# Patient Record
Sex: Male | Born: 1935 | Race: White | Hispanic: No | Marital: Married | Smoking: Never smoker
Health system: Southern US, Community
[De-identification: ages and names within clinical notes are randomized; demographics above are authoritative.]

## PROBLEM LIST (undated history)

## (undated) DIAGNOSIS — E119 Type 2 diabetes mellitus without complications: Secondary | ICD-10-CM

## (undated) DIAGNOSIS — F0390 Unspecified dementia without behavioral disturbance: Secondary | ICD-10-CM

## (undated) DIAGNOSIS — R42 Dizziness and giddiness: Secondary | ICD-10-CM

## (undated) DIAGNOSIS — J189 Pneumonia, unspecified organism: Secondary | ICD-10-CM

## (undated) DIAGNOSIS — K759 Inflammatory liver disease, unspecified: Secondary | ICD-10-CM

## (undated) DIAGNOSIS — C61 Malignant neoplasm of prostate: Secondary | ICD-10-CM

## (undated) DIAGNOSIS — E785 Hyperlipidemia, unspecified: Secondary | ICD-10-CM

## (undated) DIAGNOSIS — N400 Enlarged prostate without lower urinary tract symptoms: Secondary | ICD-10-CM

## (undated) DIAGNOSIS — I639 Cerebral infarction, unspecified: Secondary | ICD-10-CM

## (undated) DIAGNOSIS — F513 Sleepwalking [somnambulism]: Secondary | ICD-10-CM

## (undated) DIAGNOSIS — I10 Essential (primary) hypertension: Secondary | ICD-10-CM

## (undated) HISTORY — PX: PROSTATECTOMY: SHX69

---

## 1975-05-05 DIAGNOSIS — K759 Inflammatory liver disease, unspecified: Secondary | ICD-10-CM

## 1975-05-05 HISTORY — DX: Inflammatory liver disease, unspecified: K75.9

## 2011-10-24 ENCOUNTER — Encounter (HOSPITAL_BASED_OUTPATIENT_CLINIC_OR_DEPARTMENT_OTHER): Payer: Self-pay

## 2011-10-24 ENCOUNTER — Emergency Department (HOSPITAL_BASED_OUTPATIENT_CLINIC_OR_DEPARTMENT_OTHER)
Admission: EM | Admit: 2011-10-24 | Discharge: 2011-10-24 | Disposition: A | Discharge status: Home / Self Care | Payer: Medicare Other | Attending: Emergency Medicine | Admitting: Emergency Medicine

## 2011-10-24 DIAGNOSIS — I10 Essential (primary) hypertension: Secondary | ICD-10-CM

## 2011-10-24 DIAGNOSIS — F0390 Unspecified dementia without behavioral disturbance: Secondary | ICD-10-CM | POA: Insufficient documentation

## 2011-10-24 DIAGNOSIS — Z87891 Personal history of nicotine dependence: Secondary | ICD-10-CM | POA: Insufficient documentation

## 2011-10-24 DIAGNOSIS — E785 Hyperlipidemia, unspecified: Secondary | ICD-10-CM | POA: Insufficient documentation

## 2011-10-24 HISTORY — DX: Benign prostatic hyperplasia without lower urinary tract symptoms: N40.0

## 2011-10-24 HISTORY — DX: Dizziness and giddiness: R42

## 2011-10-24 HISTORY — DX: Hyperlipidemia, unspecified: E78.5

## 2011-10-24 HISTORY — DX: Unspecified dementia, unspecified severity, without behavioral disturbance, psychotic disturbance, mood disturbance, and anxiety: F03.90

## 2011-10-24 MED ORDER — AMLODIPINE BESYLATE 10 MG PO TABS: 5.0000 mg | ORAL_TABLET | Freq: Every day | ORAL | Status: DC

## 2011-10-24 NOTE — Discharge Instructions (Signed)
Arterial Hypertension Arterial hypertension (high blood pressure) is a condition of elevated pressure in your blood vessels. Hypertension over a long period of time is a risk factor for strokes, heart attacks, and heart failure. It is also the leading cause of kidney (renal) failure.  CAUSES   In Adults -- Over 90% of all hypertension has no known cause. This is called essential or primary hypertension. In the other 10% of people with hypertension, the increase in blood pressure is caused by another disorder. This is called secondary hypertension. Important causes of secondary hypertension are:   Heavy alcohol use.   Obstructive sleep apnea.   Hyperaldosterosim (Conn's syndrome).   Steroid use.   Chronic kidney failure.   Hyperparathyroidism.   Medications.   Renal artery stenosis.   Pheochromocytoma.   Cushing's disease.   Coarctation of the aorta.   Scleroderma renal crisis.   Licorice (in excessive amounts).   Drugs (cocaine, methamphetamine).  Your caregiver can explain any items above that apply to you.  In Children -- Secondary hypertension is more common and should always be considered.   Pregnancy -- Few women of childbearing age have high blood pressure. However, up to 10% of them develop hypertension of pregnancy. Generally, this will not harm the woman. It may be a sign of 3 complications of pregnancy: preeclampsia, HELLP syndrome, and eclampsia. Follow up and control with medication is necessary.  SYMPTOMS   This condition normally does not produce any noticeable symptoms. It is usually found during a routine exam.   Malignant hypertension is a late problem of high blood pressure. It may have the following symptoms:   Headaches.   Blurred vision.   End-organ damage (this means your kidneys, heart, lungs, and other organs are being damaged).   Stressful situations can increase the blood pressure. If a person with normal blood pressure has their blood  pressure go up while being seen by their caregiver, this is often termed "white coat hypertension." Its importance is not known. It may be related with eventually developing hypertension or complications of hypertension.   Hypertension is often confused with mental tension, stress, and anxiety.  DIAGNOSIS  The diagnosis is made by 3 separate blood pressure measurements. They are taken at least 1 week apart from each other. If there is organ damage from hypertension, the diagnosis may be made without repeat measurements. Hypertension is usually identified by having blood pressure readings:  Above 140/90 mmHg measured in both arms, at 3 separate times, over a couple weeks.   Over 130/80 mmHg should be considered a risk factor and may require treatment in patients with diabetes.  Blood pressure readings over 120/80 mmHg are called "pre-hypertension" even in non-diabetic patients. To get a true blood pressure measurement, use the following guidelines. Be aware of the factors that can alter blood pressure readings.  Take measurements at least 1 hour after caffeine.   Take measurements 30 minutes after smoking and without any stress. This is another reason to quit smoking - it raises your blood pressure.   Use a proper cuff size. Ask your caregiver if you are not sure about your cuff size.   Most home blood pressure cuffs are automatic. They will measure systolic and diastolic pressures. The systolic pressure is the pressure reading at the start of sounds. Diastolic pressure is the pressure at which the sounds disappear. If you are elderly, measure pressures in multiple postures. Try sitting, lying or standing.   Sit at rest for a minimum of   5 minutes before taking measurements.   You should not be on any medications like decongestants. These are found in many cold medications.   Record your blood pressure readings and review them with your caregiver.  If you have hypertension:  Your caregiver  may do tests to be sure you do not have secondary hypertension (see "causes" above).   Your caregiver may also look for signs of metabolic syndrome. This is also called Syndrome X or Insulin Resistance Syndrome. You may have this syndrome if you have type 2 diabetes, abdominal obesity, and abnormal blood lipids in addition to hypertension.   Your caregiver will take your medical and family history and perform a physical exam.   Diagnostic tests may include blood tests (for glucose, cholesterol, potassium, and kidney function), a urinalysis, or an EKG. Other tests may also be necessary depending on your condition.  PREVENTION  There are important lifestyle issues that you can adopt to reduce your chance of developing hypertension:  Maintain a normal weight.   Limit the amount of salt (sodium) in your diet.   Exercise often.   Limit alcohol intake.   Get enough potassium in your diet. Discuss specific advice with your caregiver.   Follow a DASH diet (dietary approaches to stop hypertension). This diet is rich in fruits, vegetables, and low-fat dairy products, and avoids certain fats.  PROGNOSIS  Essential hypertension cannot be cured. Lifestyle changes and medical treatment can lower blood pressure and reduce complications. The prognosis of secondary hypertension depends on the underlying cause. Many people whose hypertension is controlled with medicine or lifestyle changes can live a normal, healthy life.  RISKS AND COMPLICATIONS  While high blood pressure alone is not an illness, it often requires treatment due to its short- and long-term effects on many organs. Hypertension increases your risk for:  CVAs or strokes (cerebrovascular accident).   Heart failure due to chronically high blood pressure (hypertensive cardiomyopathy).   Heart attack (myocardial infarction).   Damage to the retina (hypertensive retinopathy).   Kidney failure (hypertensive nephropathy).  Your caregiver can  explain list items above that apply to you. Treatment of hypertension can significantly reduce the risk of complications. TREATMENT   For overweight patients, weight loss and regular exercise are recommended. Physical fitness lowers blood pressure.   Mild hypertension is usually treated with diet and exercise. A diet rich in fruits and vegetables, fat-free dairy products, and foods low in fat and salt (sodium) can help lower blood pressure. Decreasing salt intake decreases blood pressure in a 1/3 of people.   Stop smoking if you are a smoker.  The steps above are highly effective in reducing blood pressure. While these actions are easy to suggest, they are difficult to achieve. Most patients with moderate or severe hypertension end up requiring medications to bring their blood pressure down to a normal level. There are several classes of medications for treatment. Blood pressure pills (antihypertensives) will lower blood pressure by their different actions. Lowering the blood pressure by 10 mmHg may decrease the risk of complications by as much as 25%. The goal of treatment is effective blood pressure control. This will reduce your risk for complications. Your caregiver will help you determine the best treatment for you according to your lifestyle. What is excellent treatment for one person, may not be for you. HOME CARE INSTRUCTIONS   Do not smoke.   Follow the lifestyle changes outlined in the "Prevention" section.   If you are on medications, follow the directions   carefully. Blood pressure medications must be taken as prescribed. Skipping doses reduces their benefit. It also puts you at risk for problems.   Follow up with your caregiver, as directed.   If you are asked to monitor your blood pressure at home, follow the guidelines in the "Diagnosis" section above.  SEEK MEDICAL CARE IF:   You think you are having medication side effects.   You have recurrent headaches or lightheadedness.     You have swelling in your ankles.   You have trouble with your vision.  SEEK IMMEDIATE MEDICAL CARE IF:   You have sudden onset of chest pain or pressure, difficulty breathing, or other symptoms of a heart attack.   You have a severe headache.   You have symptoms of a stroke (such as sudden weakness, difficulty speaking, difficulty walking).  MAKE SURE YOU:   Understand these instructions.   Will watch your condition.   Will get help right away if you are not doing well or get worse.  Document Released: 04/20/2005 Document Revised: 04/09/2011 Document Reviewed: 11/18/2006 [employer] Patient Information 2012 [employer]. 

## 2011-10-24 NOTE — ED Provider Notes (Signed)
History     CSN: [id number]  Arrival date & time 10/24/11  1213   First MD Initiated Contact with Patient 10/24/11 1248      Chief Complaint  Patient presents with  . Hypertension    (Consider location/radiation/quality/duration/timing/severity/associated sxs/prior treatment) HPI Pt brought to the ED by his daughter after his wife checked his BP this morning and found it to be high, 195/71. His PCP advised him to come to the ED for evaluation. He has no known history of HTN, but has had several elevated readings on a home BP monitor recently. He has not had any concerning symptoms. No CP, SOB, headache blurry vision, nausea or vomiting.   Past Medical History  Diagnosis Date  . Hyperlipidemia   . Dementia   . Vertigo   . Benign prostatic hypertrophy     Past Surgical History  Procedure Date  . Prostatectomy     No family history on file.  History  Substance Use Topics  . Smoking status: Former Games developer  . Smokeless tobacco: Not on file  . Alcohol Use: No      Review of Systems All other systems reviewed and are negative except as noted in HPI.   Allergies  Review of patient's allergies indicates no known allergies.  Home Medications   Current Outpatient Rx  Name Route Sig Dispense Refill  . ASPIRIN 81 MG PO TABS Oral Take 81 mg by mouth daily.    . DONEPEZIL HCL 10 MG PO TABS Oral Take 10 mg by mouth at bedtime as needed.    Marland Kitchen GLIMEPIRIDE 2 MG PO TABS Oral Take 2 mg by mouth daily before breakfast.    . MECLIZINE HCL 12.5 MG PO TABS Oral Take 12.5 mg by mouth 3 (three) times daily as needed.    Marland Kitchen MEMANTINE HCL 10 MG PO TABS Oral Take 10 mg by mouth 2 (two) times daily.    . OXYBUTYNIN CHLORIDE ER 10 MG PO TB24 Oral Take 10 mg by mouth daily.    Marland Kitchen PRAVASTATIN SODIUM 40 MG PO TABS Oral Take 40 mg by mouth daily.      BP 131/57  Pulse 62  Temp 98.7 F (37.1 C)  Resp 16  Ht 5\' 5"  (1.651 m)  Wt 140 lb (63.504 kg)  BMI 23.30 kg/m2  SpO2 97%  Physical  Exam  Nursing note and vitals reviewed. Constitutional: He is oriented to person, place, and time. He appears well-developed and well-nourished.  HENT:  Head: Normocephalic and atraumatic.  Eyes: EOM are normal. Pupils are equal, round, and reactive to light.  Neck: Normal range of motion. Neck supple.  Cardiovascular: Normal rate, normal heart sounds and intact distal pulses.   Pulmonary/Chest: Effort normal and breath sounds normal.  Abdominal: Bowel sounds are normal. He exhibits no distension. There is no tenderness.  Musculoskeletal: Normal range of motion. He exhibits no edema and no tenderness.  Neurological: He is alert and oriented to person, place, and time. He has normal strength. No cranial nerve deficit or sensory deficit.  Skin: Skin is warm and dry. No rash noted.  Psychiatric: He has a normal mood and affect.    ED Course  Procedures (including critical care time)  Labs Reviewed - No data to display No results found.   No diagnosis found.    MDM  Pt with asymptomatic HTN. BP here is only slightly elevated. Log of BP readings from home ranging from 140-170/70-90s. Advised daughter to ensure home BP cuff is  accurate. Will give Rx for Norvasc 5mg  and advised close PCP followup for recheck this week.         Atley Neubert B. Bernette Mayers, MD 10/24/11 1304

## 2011-10-24 NOTE — ED Notes (Signed)
PT c/o HTN.  Pts daughter states BP was 195/71 at home, called PCP Rosey Bath Stump D.O.) and was advised to bring him to ED for evaluation.

## 2012-07-27 ENCOUNTER — Encounter (HOSPITAL_COMMUNITY): Payer: Self-pay

## 2012-07-27 ENCOUNTER — Observation Stay (HOSPITAL_COMMUNITY)
Admission: EM | Admit: 2012-07-27 | Discharge: 2012-07-29 | Disposition: A | Discharge status: Home / Self Care | Payer: Medicare Other | Attending: Internal Medicine | Admitting: Internal Medicine

## 2012-07-27 ENCOUNTER — Emergency Department (HOSPITAL_COMMUNITY): Payer: Medicare Other

## 2012-07-27 DIAGNOSIS — G459 Transient cerebral ischemic attack, unspecified: Principal | ICD-10-CM | POA: Diagnosis present

## 2012-07-27 DIAGNOSIS — R42 Dizziness and giddiness: Secondary | ICD-10-CM | POA: Diagnosis present

## 2012-07-27 DIAGNOSIS — R4789 Other speech disturbances: Secondary | ICD-10-CM | POA: Insufficient documentation

## 2012-07-27 DIAGNOSIS — R4182 Altered mental status, unspecified: Secondary | ICD-10-CM | POA: Insufficient documentation

## 2012-07-27 DIAGNOSIS — R5381 Other malaise: Secondary | ICD-10-CM | POA: Insufficient documentation

## 2012-07-27 DIAGNOSIS — R29898 Other symptoms and signs involving the musculoskeletal system: Secondary | ICD-10-CM | POA: Insufficient documentation

## 2012-07-27 DIAGNOSIS — E119 Type 2 diabetes mellitus without complications: Secondary | ICD-10-CM | POA: Diagnosis present

## 2012-07-27 DIAGNOSIS — Z66 Do not resuscitate: Secondary | ICD-10-CM | POA: Insufficient documentation

## 2012-07-27 DIAGNOSIS — F0390 Unspecified dementia without behavioral disturbance: Secondary | ICD-10-CM | POA: Insufficient documentation

## 2012-07-27 DIAGNOSIS — I10 Essential (primary) hypertension: Secondary | ICD-10-CM | POA: Diagnosis present

## 2012-07-27 DIAGNOSIS — E860 Dehydration: Secondary | ICD-10-CM | POA: Insufficient documentation

## 2012-07-27 HISTORY — DX: Essential (primary) hypertension: I10

## 2012-07-27 LAB — URINALYSIS, ROUTINE W REFLEX MICROSCOPIC
Ketones, ur: NEGATIVE mg/dL
Leukocytes, UA: NEGATIVE
Nitrite: NEGATIVE
Urobilinogen, UA: 0.2 mg/dL (ref 0.0–1.0)
pH: 5 (ref 5.0–8.0)

## 2012-07-27 LAB — CBC
Platelets: 184 10*3/uL (ref 150–400)
RBC: 4.1 MIL/uL — ABNORMAL LOW (ref 4.22–5.81)
WBC: 11.8 10*3/uL — ABNORMAL HIGH (ref 4.0–10.5)

## 2012-07-27 LAB — DIFFERENTIAL
Lymphocytes Relative: 7 % — ABNORMAL LOW (ref 12–46)
Lymphs Abs: 0.8 10*3/uL (ref 0.7–4.0)
Neutrophils Relative %: 88 % — ABNORMAL HIGH (ref 43–77)

## 2012-07-27 LAB — COMPREHENSIVE METABOLIC PANEL
ALT: 17 U/L (ref 0–53)
Alkaline Phosphatase: 53 U/L (ref 39–117)
CO2: 23 mEq/L (ref 19–32)
GFR calc Af Amer: 46 mL/min — ABNORMAL LOW (ref >90)
GFR calc non Af Amer: 39 mL/min — ABNORMAL LOW (ref >90)
Glucose, Bld: 225 mg/dL — ABNORMAL HIGH (ref 70–99)
Potassium: 4.5 mEq/L (ref 3.5–5.1)
Sodium: 135 mEq/L (ref 135–145)
Total Protein: 6.6 g/dL (ref 6.0–8.3)

## 2012-07-27 LAB — RAPID URINE DRUG SCREEN, HOSP PERFORMED
Barbiturates: NOT DETECTED
Benzodiazepines: NOT DETECTED
Tetrahydrocannabinol: NOT DETECTED

## 2012-07-27 LAB — ETHANOL: Alcohol, Ethyl (B): <11 mg/dL (ref 0–11)

## 2012-07-27 NOTE — ED Notes (Signed)
EMS- pt lives at home with wife. Pt has Hx of dementia. Family states has been more altered today than usual.  Has been not making sense when talking and was weak unable to walk after supper.  Wife also states he has had some frequent urination and c/o burning when he pees.

## 2012-07-27 NOTE — ED Notes (Signed)
Stood pt up to get urine sample.  Pt very unstable on his feet.

## 2012-07-27 NOTE — ED Provider Notes (Signed)
History     CSN: [id number]  Arrival date & time 07/27/12  2050   First MD Initiated Contact with Patient 07/27/12 2106      Chief Complaint  Patient presents with  . Altered Mental Status     Patient is a 79 y.o. male presenting with altered mental status. The history is provided by the patient and a relative.  Altered Mental Status This is a new problem. Episode onset: several hrs ago. The problem occurs constantly. The problem has been gradually improving. Pertinent negatives include no chest pain, no abdominal pain, no headaches and no shortness of breath. Nothing aggravates the symptoms. Nothing relieves the symptoms. He has tried nothing for the symptoms.  family reports that patient had slurred speech earlier tonight (over 3 hrs ago) but no focal weakness.  He did appear more confused but he has h/o dementia and will become confused at times.  He is now improving.    Pt currently denies any complaints He denies HA/CP/SOB.  He denies feeling weak No recent falls in the past 24 hours are reported Past Medical History  Diagnosis Date  . Hyperlipidemia   . Dementia   . Vertigo   . Benign prostatic hypertrophy   . Diabetes mellitus without complication   . Hypertension     Past Surgical History  Procedure Laterality Date  . Prostatectomy      No family history on file.  History  Substance Use Topics  . Smoking status: Never Smoker   . Smokeless tobacco: Not on file  . Alcohol Use: No      Review of Systems  Constitutional: Negative for fever.  Respiratory: Negative for shortness of breath.   Cardiovascular: Negative for chest pain.  Gastrointestinal: Negative for abdominal pain.  Genitourinary: Positive for dysuria and frequency.  Musculoskeletal: Negative for back pain.  Neurological: Negative for headaches.  Psychiatric/Behavioral: Positive for altered mental status. Negative for agitation.  All other systems reviewed and are negative.    Allergies   Review of patient's allergies indicates no known allergies.  Home Medications   Current Outpatient Rx  Name  Route  Sig  Dispense  Refill  . amLODipine (NORVASC) 5 MG tablet   Oral   Take 5 mg by mouth daily.         Marland Kitchen aspirin 81 MG tablet   Oral   Take 81 mg by mouth daily.         . benazepril (LOTENSIN) 20 MG tablet   Oral   Take 20 mg by mouth daily.         Marland Kitchen donepezil (ARICEPT) 10 MG tablet   Oral   Take 10 mg by mouth at bedtime.          . memantine (NAMENDA) 10 MG tablet   Oral   Take 10 mg by mouth 2 (two) times daily.         Marland Kitchen oxybutynin (DITROPAN-XL) 10 MG 24 hr tablet   Oral   Take 10 mg by mouth daily.         . pravastatin (PRAVACHOL) 40 MG tablet   Oral   Take 40 mg by mouth daily.         Marland Kitchen glimepiride (AMARYL) 2 MG tablet   Oral   Take 2 mg by mouth daily before breakfast.           BP 128/63  Pulse 81  Temp(Src) 99.7 F (37.6 C) (Oral)  Resp 30  SpO2 96%  Physical Exam CONSTITUTIONAL: Well developed/well nourished HEAD: Normocephalic/atraumatic EYES: EOMI/PERRL, no nystagmus ENMT: Mucous membranes moist NECK: supple no meningeal signs SPINE:entire spine nontender CV: S1/S2 noted, no murmurs/rubs/gallops noted LUNGS: Lungs are clear to auscultation bilaterally, no apparent distress (resp rate is not 30 on my exam) ABDOMEN: soft, nontender, no rebound or guarding GU:no cva tenderness NEURO: Pt is awake/alert, moves all extremitiesx4 No arm or leg drift.  No facial droop. There is no dysarthria and no aphasia.  He is interactive.   EXTREMITIES: pulses normal, full ROM, no tenderness, no deformity SKIN: warm, color normal PSYCH: no abnormalities of mood noted  ED Course  Procedures (including critical care time)  Labs Reviewed  CBC - Abnormal; Notable for the following:    WBC 11.8 (*)    RBC 4.10 (*)    Hemoglobin 12.6 (*)    HCT 36.8 (*)    All other components within normal limits  DIFFERENTIAL - Abnormal;  Notable for the following:    Neutrophils Relative 88 (*)    Neutro Abs 10.4 (*)    Lymphocytes Relative 7 (*)    All other components within normal limits  ETHANOL  COMPREHENSIVE METABOLIC PANEL  TROPONIN I  URINE RAPID DRUG SCREEN (HOSP PERFORMED)  URINALYSIS, ROUTINE W REFLEX MICROSCOPIC   Dg Chest 2 View  07/27/2012  *RADIOLOGY REPORT*  Clinical Data: Altered mental status.  CHEST - 2 VIEW  Comparison: None.  Findings: The cardiac silhouette, mediastinal and hilar contours are within normal limits for age.  The lungs are clear.  No pleural effusion.  Chronic-appearing bronchitic lung changes.  Colonic interposition noted with bowel on the right hemidiaphragm.  The bony thorax is intact.  IMPRESSION:  1.  Chronic bronchitic type lung changes. 2.  No acute cardiopulmonary findings.   Original Report Authenticated By: Rudie Meyer, M.D.    Pt presents with slurred speech earlier that is improving.    tPA in stroke considered but not given due to:  Symptoms resolved  11:09 PM Pt currently stable.  He is well appearing.  He does not appear to have any residual deficit.  Will admit for possible TIA Patient/family agreeable 11:32 PM D/w dr Adela Glimpse, will admit for TIA observation stay Pt currently in no distress on my exam    MDM  Nursing notes including past medical history and social history reviewed and considered in documentation xrays reviewed and considered Labs/vital reviewed and considered        Date: 07/27/2012  Rate: 80  Rhythm: normal sinus rhythm  QRS Axis: left  Intervals: normal  ST/T Wave abnormalities: nonspecific ST changes  Conduction Disutrbances:none  Narrative Interpretation:   Old EKG Reviewed: none available at time of interpretation    Joya Gaskins, MD 07/27/12 2333

## 2012-07-28 ENCOUNTER — Observation Stay (HOSPITAL_COMMUNITY): Payer: Medicare Other

## 2012-07-28 ENCOUNTER — Encounter (HOSPITAL_COMMUNITY): Payer: Self-pay | Admitting: Internal Medicine

## 2012-07-28 DIAGNOSIS — G459 Transient cerebral ischemic attack, unspecified: Secondary | ICD-10-CM | POA: Diagnosis present

## 2012-07-28 DIAGNOSIS — E119 Type 2 diabetes mellitus without complications: Secondary | ICD-10-CM | POA: Diagnosis present

## 2012-07-28 DIAGNOSIS — R42 Dizziness and giddiness: Secondary | ICD-10-CM | POA: Diagnosis present

## 2012-07-28 DIAGNOSIS — I10 Essential (primary) hypertension: Secondary | ICD-10-CM | POA: Diagnosis present

## 2012-07-28 LAB — BASIC METABOLIC PANEL
BUN: 26 mg/dL — ABNORMAL HIGH (ref 6–23)
Chloride: 100 mEq/L (ref 96–112)
Creatinine, Ser: 1.47 mg/dL — ABNORMAL HIGH (ref 0.50–1.35)
GFR calc Af Amer: 47 mL/min — ABNORMAL LOW (ref >90)
Glucose, Bld: 237 mg/dL — ABNORMAL HIGH (ref 70–99)

## 2012-07-28 LAB — LIPID PANEL
LDL Cholesterol: 58 mg/dL (ref 0–99)
VLDL: 14 mg/dL (ref 0–40)

## 2012-07-28 LAB — GLUCOSE, CAPILLARY: Glucose-Capillary: 202 mg/dL — ABNORMAL HIGH (ref 70–99)

## 2012-07-28 MED ORDER — ACETAMINOPHEN 325 MG PO TABS
650.0000 mg | ORAL_TABLET | ORAL | Status: DC | PRN
  Administered 2012-07-28: 650 mg via ORAL
  Filled 2012-07-28: qty 2

## 2012-07-28 MED ORDER — ASPIRIN 325 MG PO TABS
325.0000 mg | ORAL_TABLET | Freq: Every day | ORAL | Status: DC
  Administered 2012-07-28 – 2012-07-29 (×2): 325 mg via ORAL
  Filled 2012-07-28 (×3): qty 1

## 2012-07-28 MED ORDER — SODIUM CHLORIDE 0.9 % IV SOLN
INTRAVENOUS | Status: AC
  Administered 2012-07-28: 02:00:00 via INTRAVENOUS

## 2012-07-28 MED ORDER — OXYBUTYNIN CHLORIDE ER 10 MG PO TB24
10.0000 mg | ORAL_TABLET | Freq: Every day | ORAL | Status: DC
  Administered 2012-07-28 – 2012-07-29 (×2): 10 mg via ORAL
  Filled 2012-07-28 (×2): qty 1

## 2012-07-28 MED ORDER — DONEPEZIL HCL 10 MG PO TABS
10.0000 mg | ORAL_TABLET | Freq: Every day | ORAL | Status: DC
  Administered 2012-07-28 (×2): 10 mg via ORAL
  Filled 2012-07-28 (×3): qty 1

## 2012-07-28 MED ORDER — INSULIN ASPART 100 UNIT/ML ~~LOC~~ SOLN
0.0000 [IU] | SUBCUTANEOUS | Status: DC
  Administered 2012-07-28: 3 [IU] via SUBCUTANEOUS

## 2012-07-28 MED ORDER — INSULIN ASPART 100 UNIT/ML ~~LOC~~ SOLN
0.0000 [IU] | SUBCUTANEOUS | Status: DC
  Administered 2012-07-28: 3 [IU] via SUBCUTANEOUS
  Administered 2012-07-29 (×2): 2 [IU] via SUBCUTANEOUS

## 2012-07-28 MED ORDER — SIMVASTATIN 20 MG PO TABS
20.0000 mg | ORAL_TABLET | Freq: Every day | ORAL | Status: DC
  Administered 2012-07-28: 20 mg via ORAL
  Filled 2012-07-28 (×2): qty 1

## 2012-07-28 MED ORDER — MEMANTINE HCL 10 MG PO TABS
10.0000 mg | ORAL_TABLET | Freq: Two times a day (BID) | ORAL | Status: DC
  Administered 2012-07-28 – 2012-07-29 (×4): 10 mg via ORAL
  Filled 2012-07-28 (×5): qty 1

## 2012-07-28 MED ORDER — FESOTERODINE FUMARATE ER 4 MG PO TB24
4.0000 mg | ORAL_TABLET | Freq: Every day | ORAL | Status: DC
  Administered 2012-07-28 – 2012-07-29 (×2): 4 mg via ORAL
  Filled 2012-07-28 (×2): qty 1

## 2012-07-28 NOTE — H&P (Signed)
PCP: Marinda Elk   Chief Complaint: Trouble standing up  HPI: Casey Whitney is a 79 y.o. male   has a past medical history of Hyperlipidemia; Dementia; Vertigo; Benign prostatic hypertrophy; Diabetes mellitus without complication; and Hypertension.   Presented with  He developed sudden confusion had slurred speech and started to have bilateral hand shaking. He was generally so weak he could hardly keep his head up but did not lose consciousness. His wife gave him some some bread and butter that he has  vomited shortly thereafter. He could not stand up and EMS was called. Once EMS arrived he rapidly improved. His blood sugar was slightly elevated in 200's. His blood pressure was noted to be 170s over 80s By EMS evaluation he seemed he had generalized weakness and no localized symptoms. Per wife he told her he was a bit weker on the left side but this has not been confirmed by anybody exam. On arrival to ER patient is back to baseline. He did have a low-grade fever up to 99.7 but there was no evidence of infection. Per wife he had not had fever at home. Chest x-ray did not show any evidence of infiltrate or pneumonia. Per his wife he has been coughing but not coughing up any phlegm. Family states he have had some low blood sugars but they're not sure if the blood sugar monitor is accurate. We have held his Amaryl because on occasion his blood sugars drop. [facility] hospitalist called on admission of evaluation of possible TIA versus presyncope  Review of Systems:     Pertinent positives include: localizing neurological complaints,  slurred speech, fatigue,  Constitutional:  No weight loss, night sweats, Fevers, chills,  weight loss  HEENT:  No headaches, Difficulty swallowing,Tooth/dental problems,Sore throat,  No sneezing, itching, ear ache, nasal congestion, post nasal drip,  Cardio-vascular:  No chest pain, Orthopnea, PND, anasarca, dizziness, palpitations.no Bilateral lower extremity  swelling  GI:  No heartburn, indigestion, abdominal pain, nausea, vomiting, diarrhea, change in bowel habits, loss of appetite, melena, blood in stool, hematemesis Resp:  no shortness of breath at rest. No dyspnea on exertion, No excess mucus, no productive cough, No non-productive cough, No coughing up of blood.No change in color of mucus.No wheezing. Skin:  no rash or lesions. No jaundice GU:  no dysuria, change in color of urine, no urgency or frequency. No straining to urinate.  No flank pain.  Musculoskeletal:  No joint pain or no joint swelling. No decreased range of motion. No back pain.  Psych:  No change in mood or affect. No depression or anxiety. No memory loss.  Neuro: nono tingling, no weakness, no double vision, no gait abnormality, nono confusion  Otherwise ROS are negative except for above, 10 systems were reviewed  Past Medical History: Past Medical History  Diagnosis Date  . Hyperlipidemia   . Dementia   . Vertigo   . Benign prostatic hypertrophy   . Diabetes mellitus without complication   . Hypertension    Past Surgical History  Procedure Laterality Date  . Prostatectomy       Medications: Prior to Admission medications   Medication Sig Start Date End Date Taking? Authorizing Provider  amLODipine (NORVASC) 5 MG tablet Take 5 mg by mouth every evening.    Yes Historical Provider, MD  aspirin 81 MG tablet Take 81 mg by mouth daily.   Yes Historical Provider, MD  benazepril (LOTENSIN) 20 MG tablet Take 20 mg by mouth daily.   Yes Historical Provider,  MD  donepezil (ARICEPT) 10 MG tablet Take 10 mg by mouth at bedtime.    Yes Historical Provider, MD  ferrous sulfate 325 (65 FE) MG tablet Take 325 mg by mouth daily with breakfast.   Yes Historical Provider, MD  fish oil-omega-3 fatty acids 1000 MG capsule Take 1 g by mouth 2 (two) times daily.   Yes Historical Provider, MD  glimepiride (AMARYL) 2 MG tablet Take 2 mg by mouth daily before breakfast.   Yes  Historical Provider, MD  memantine (NAMENDA) 10 MG tablet Take 10 mg by mouth 2 (two) times daily.   Yes Historical Provider, MD  menthol-zinc oxide (GOLD BOND) powder Apply 1 application topically every 7 (seven) days. As needed for foot fungus   Yes Historical Provider, MD  oxybutynin (DITROPAN-XL) 10 MG 24 hr tablet Take 10 mg by mouth daily.   Yes Historical Provider, MD  pravastatin (PRAVACHOL) 40 MG tablet Take 40 mg by mouth daily.   Yes Historical Provider, MD  tolterodine (DETROL LA) 2 MG 24 hr capsule Take 2 mg by mouth daily.   Yes Historical Provider, MD    Allergies:  No Known Allergies  Social History:  Ambulatory independently Lives at home with wife   reports that he has never smoked. He does not have any smokeless tobacco history on file. He reports that he does not drink alcohol or use illicit drugs.   Family History: family history includes Dementia in his sister.    Physical Exam: Patient Vitals for the past 24 hrs:  BP Temp Temp src Pulse Resp SpO2  07/28/12 0000 119/50 mmHg - - 81 25 94 %  07/27/12 2345 106/75 mmHg - - 83 27 93 %  07/27/12 2330 119/49 mmHg - - 78 26 89 %  07/27/12 2315 111/55 mmHg - - 79 28 91 %  07/27/12 2300 124/56 mmHg - - 78 25 93 %  07/27/12 2245 118/63 mmHg - - 80 30 93 %  07/27/12 2230 132/54 mmHg - - 76 27 94 %  07/27/12 2215 138/60 mmHg - - 77 30 94 %  07/27/12 2102 128/63 mmHg 99.7 F (37.6 C) Oral 81 30 96 %    1. General:  in No Acute distress 2. Psychological: Alert and  Oriented 3. Head/ENT:   Moist Mucous Membranes                          Head Non traumatic, neck supple                          Poor Dentition 4. SKIN:  decreased Skin turgor,  Skin clean Dry and intact no rash 5. Heart: Regular rate and rhythm no Murmur, Rub or gallop 6. Lungs: Clear to auscultation bilaterally, no wheezes or crackles   7. Abdomen: Soft, non-tender, Non distended 8. Lower extremities: no clubbing, cyanosis, or edema 9. Neurologically  strength 5 out of 5 in no 4 extremities cranial nerves II through XII intact no facial droop noted. No pronator drift noted. Did not ambulate the patient 10. MSK: Normal range of motion  body mass index is unknown because there is no weight on file.   Labs on Admission:   Recent Labs  07/27/12 2125  NA 135  K 4.5  CL 100  CO2 23  GLUCOSE 225*  BUN 29*  CREATININE 1.51*  CALCIUM 9.5    Recent Labs  07/27/12 2125  AST 19  ALT 17  ALKPHOS 53  BILITOT 0.4  PROT 6.6  ALBUMIN 3.6   No results found for this basename: LIPASE, AMYLASE,  in the last 72 hours  Recent Labs  07/27/12 2125  WBC 11.8*  NEUTROABS 10.4*  HGB 12.6*  HCT 36.8*  MCV 89.8  PLT 184    Recent Labs  07/27/12 2124  TROPONINI <0.30   No results found for this basename: TSH, T4TOTAL, FREET3, T3FREE, THYROIDAB,  in the last 72 hours No results found for this basename: VITAMINB12, FOLATE, FERRITIN, TIBC, IRON, RETICCTPCT,  in the last 72 hours No results found for this basename: HGBA1C    The CrCl is unknown because both a height and weight (above a minimum accepted value) are required for this calculation. ABG No results found for this basename: phart, pco2, po2, hco3, tco2, acidbasedef, o2sat     No results found for this basename: DDIMER     Other results:  I have pearsonaly reviewed this: ECG REPORT  Rate:80   Rhythm:SR left anterior block  ST&T Change:no evidence of ischemia  UA  No evidence of UTI   Cultures: No results found for this basename: sdes, specrequest, cult, reptstatus       Radiological Exams on Admission: Dg Chest 2 View  07/27/2012  *RADIOLOGY REPORT*  Clinical Data: Altered mental status.  CHEST - 2 VIEW  Comparison: None.  Findings: The cardiac silhouette, mediastinal and hilar contours are within normal limits for age.  The lungs are clear.  No pleural effusion.  Chronic-appearing bronchitic lung changes.  Colonic interposition noted with bowel on the right  hemidiaphragm.  The bony thorax is intact.  IMPRESSION:  1.  Chronic bronchitic type lung changes. 2.  No acute cardiopulmonary findings.   Original Report Authenticated By: Rudie Meyer, M.D.    Ct Head Wo Contrast  07/27/2012  *RADIOLOGY REPORT*  Clinical Data: Altered mental status.  CT HEAD WITHOUT CONTRAST  Technique:  Contiguous axial images were obtained from the base of the skull through the vertex without contrast.  Comparison: None  Findings: Age related cerebral atrophy, ventriculomegaly and periventricular white matter disease.  No extra-axial fluid collections are identified.  No CT findings for acute hemispheric infarction and/or intracranial hemorrhage.  No mass lesions.  The brainstem and cerebellum grossly normal.  Extensive vascular calcifications are noted.  The bony structures are intact. The right frontal sinus is opacified.  There is scattered ethmoid disease also.  Probable chronic mastoid effusions.  IMPRESSION:  1.  Age related cerebral atrophy, ventriculomegaly and periventricular white matter disease. 2.  No acute intracranial findings or mass lesions. 3.  Scattered sinus disease.   Original Report Authenticated By: Rudie Meyer, M.D.     Chart has been reviewed  Assessment/Plan  79 year old gentleman with history of hypertension here with slurred speech it has rapidly resolved with questionable left-sided subjective weakness. We'll admit for TIA workup but also watch for any signs of emergent infection. Monitor closely blood sugars.  Present on Admission:  . TIA (transient ischemic attack) - given slurred speech and questionable left-sided weakness for admit for today workup. Will order MRI/MRA, and 2-D echo carotid Dopplers. Also given vague symptoms will cycle.. Cardiac markers. It is possible that he had an episode of hypoglycemia which was corrected by eating and normalized by the time EMS arrived. Will hold Amaryl and watch blood sugars every 4 hours.  . DM (diabetes  mellitus) - will hold by mouth medications and sensitive sliding  scale every 4 hours watch blood sugar  . HTN (hypertension) - in emergency department he had few readings of relatively low blood pressures 108/70's, will hold his Norvasc and Lotensin for now and give IV fluids.  . Lightheadedness versus presyncope - etiology unclear will have a TIA workup cycle cardiac markers watch blood sugars. Watch for any evidence of emerging infection. Mild dehydration - slightly elevated creatinine unsure what his baseline is give gentle IV fluids and hold his lisinopril.  Prophylaxis: SCD  Protonix  CODE STATUS: DNR/ DNI per family  Other plan as per orders.  I have spent a total of 55 min on this admission  Cesar Alf 07/28/2012, 12:12 AM

## 2012-07-28 NOTE — Progress Notes (Signed)
PT Cancellation Note  Patient Details Name: Casey Whitney MRN: [id number] DOB: July 26, 1921   Cancelled Treatment:    Reason Eval/Treat Not Completed: Patient not medically ready;Other (comment) (pt. has bedrest orders).  Please indicate when OK to proceed with therapy evals by increasing activity orders.  Thank you!   Ferman Hamming 07/28/2012, 11:50 AM Weldon Picking PT Acute Rehab Services [phone] [place] [phone]

## 2012-07-28 NOTE — ED Notes (Signed)
Report given to floor.  Pt transported to floor via stretcher.

## 2012-07-28 NOTE — Progress Notes (Signed)
[facility] PROGRESS NOTE  Casey Whitney ZOX:[id number] DOB: May 28, 1921 DOA: 07/27/2012 PCP: Berdine Dance, MD  Brief Narrative: He developed sudden confusion had slurred speech and started to have bilateral hand shaking. He was generally so weak he could hardly keep his head up but did not lose consciousness. His wife gave him some some bread and butter that he has vomited shortly thereafter. He could not stand up and EMS was called. Once EMS arrived he rapidly improved. His blood sugar was slightly elevated in 200's. His blood pressure was noted to be 170s over 80s  By EMS evaluation he seemed he had generalized weakness and no localized symptoms. Per wife he told her he was a bit weker on the left side but this has not been confirmed by anybody exam. On arrival to ER patient is back to baseline. He did have a low-grade fever up to 99.7 but there was no evidence of infection. Per wife he had not had fever at home. Chest x-ray did not show any evidence of infiltrate or pneumonia. Per his wife he has been coughing but not coughing up any phlegm.  Family states he have had some low blood sugars but they're not sure if the blood sugar monitor is accurate. We have held his Amaryl because on occasion his blood sugars drop.  [facility] hospitalist called on admission of evaluation of possible TIA versus presyncope  Assessment/Plan: TIA (transient ischemic attack) - given slurred speech and questionable left-sided weakness for admit for today workup. Will order MRI/MRA, and 2-D echo carotid Dopplers. It is possible that he had an episode of hypoglycemia which was corrected by eating and normalized by the time EMS arrived.   DM (diabetes mellitus) - sensitive sliding scale every 4 hours watch blood sugar  - HBA1C pending  HTN (hypertension) - in emergency department he had few readings of relatively low blood pressures 108/70's, will hold his Norvasc and Lotensin for now and give IV fluids. Controlled  BP in the morning.  Code Status: DNR Family Communication: none  Disposition Plan: TBD  Consultants:  none  Procedures:  none  Antibiotics:  none  HPI/Subjective: - no complaints this morning  Objective: Filed Vitals:   07/28/12 0000 07/28/12 0100 07/28/12 0111 07/28/12 0500  BP: 119/50 114/50  115/54  Pulse: 81 77  67  Temp:  98.4 F (36.9 C)  97.9 F (36.6 C)  TempSrc:      Resp: 25 22  18   Height:   5\' 2"  (1.575 m)   Weight:   68.6 kg (151 lb 3.8 oz)   SpO2: 94% 92%  92%   No intake or output data in the 24 hours ending 07/28/12 0807 Filed Weights   07/28/12 0111  Weight: 68.6 kg (151 lb 3.8 oz)    Exam:   General:  NAD  Cardiovascular: regular rate and rhythm, without MRG  Respiratory: good air movement, clear to auscultation throughout, no wheezing, ronchi or rales  Abdomen: soft, not tender to palpation, positive bowel sounds  MSK: no peripheral edema  Neuro: CN 2-12 grossly intact, MS 5/5 in all 4  Data Reviewed: Basic Metabolic Panel:  Recent Labs Lab 07/27/12 2125  NA 135  K 4.5  CL 100  CO2 23  GLUCOSE 225*  BUN 29*  CREATININE 1.51*  CALCIUM 9.5   Liver Function Tests:  Recent Labs Lab 07/27/12 2125  AST 19  ALT 17  ALKPHOS 53  BILITOT 0.4  PROT 6.6  ALBUMIN 3.6   CBC:  Recent Labs Lab 07/27/12 2125  WBC 11.8*  NEUTROABS 10.4*  HGB 12.6*  HCT 36.8*  MCV 89.8  PLT 184   Cardiac Enzymes:  Recent Labs Lab 07/27/12 2124  TROPONINI <0.30   CBG:  Recent Labs Lab 07/27/12 2207 07/28/12 0139 07/28/12 0647  GLUCAP 215* 202* 113*    Studies: Dg Chest 2 View  07/27/2012  *RADIOLOGY REPORT*  Clinical Data: Altered mental status.  CHEST - 2 VIEW  Comparison: None.  Findings: The cardiac silhouette, mediastinal and hilar contours are within normal limits for age.  The lungs are clear.  No pleural effusion.  Chronic-appearing bronchitic lung changes.  Colonic interposition noted with bowel on the right  hemidiaphragm.  The bony thorax is intact.  IMPRESSION:  1.  Chronic bronchitic type lung changes. 2.  No acute cardiopulmonary findings.   Original Report Authenticated By: Rudie Meyer, M.D.    Ct Head Wo Contrast  07/27/2012  *RADIOLOGY REPORT*  Clinical Data: Altered mental status.  CT HEAD WITHOUT CONTRAST  Technique:  Contiguous axial images were obtained from the base of the skull through the vertex without contrast.  Comparison: None  Findings: Age related cerebral atrophy, ventriculomegaly and periventricular white matter disease.  No extra-axial fluid collections are identified.  No CT findings for acute hemispheric infarction and/or intracranial hemorrhage.  No mass lesions.  The brainstem and cerebellum grossly normal.  Extensive vascular calcifications are noted.  The bony structures are intact. The right frontal sinus is opacified.  There is scattered ethmoid disease also.  Probable chronic mastoid effusions.  IMPRESSION:  1.  Age related cerebral atrophy, ventriculomegaly and periventricular white matter disease. 2.  No acute intracranial findings or mass lesions. 3.  Scattered sinus disease.   Original Report Authenticated By: Rudie Meyer, M.D.     Scheduled Meds: . aspirin  325 mg Oral Daily  . donepezil  10 mg Oral QHS  . fesoterodine  4 mg Oral Daily  . insulin aspart  0-9 Units Subcutaneous Q4H  . memantine  10 mg Oral BID  . oxybutynin  10 mg Oral Daily  . simvastatin  20 mg Oral q1800   Continuous Infusions: . sodium chloride 75 mL/hr at 07/28/12 0221    Active Problems:   TIA (transient ischemic attack)   DM (diabetes mellitus)   HTN (hypertension)   Lightheadedness  Pamella Pert, MD [facility] Pager [phone]. If 7 PM - 7 AM, please contact night-coverage at [email or web address], password [facility] 07/28/2012, 8:07 AM  LOS: 1 day

## 2012-07-28 NOTE — Evaluation (Signed)
Occupational Therapy Evaluation Patient Details Name: Casey Whitney MRN: [id number] DOB: [birth year] Today's Date: 07/28/2012 Time: 1410-1450 OT Time Calculation (min): 40 min  OT Assessment / Plan / Recommendation Clinical Impression  Pt demos decline in safety and balance during ADLs and ADL mobility. Pt would benefit from OT services to address these impairments to help restore PLOF to return home safely    OT Assessment  Patient needs continued OT Services    Follow Up Recommendations  Supervision/Assistance - 24 hour;Home health OT    Barriers to Discharge Decreased caregiver support Pt and his wife live with their dtr, another dtr comes over for assist prn. Pt and wife at home alone for approx 6 hrs/day   Equipment Recommendations  None recommended by OT    Recommendations for Other Services    Frequency  Min 2X/week    Precautions / Restrictions Precautions Precautions: Fall Restrictions Weight Bearing Restrictions: No   Pertinent Vitals/Pain     ADL  Eating/Feeding: Performed;Independent Where Assessed - Eating/Feeding: Edge of bed Grooming: Performed;Wash/dry hands;Wash/dry face;Min guard Where Assessed - Grooming: Supported standing Upper Body Bathing: Simulated;Supervision/safety;Set up Where Assessed - Upper Body Bathing: Unsupported sitting Lower Body Bathing: Simulated;Minimal assistance Where Assessed - Lower Body Bathing: Supported sitting Upper Body Dressing: Performed;Supervision/safety;Set up Lower Body Dressing: Minimal assistance Where Assessed - Lower Body Dressing: Supported sitting Toilet Transfer: Performed;Minimal assistance Toilet Transfer Equipment: Regular height toilet;Grab bars Toileting - Clothing Manipulation and Hygiene: Performed;Minimal assistance Where Assessed - Engineer, mining and Hygiene: Standing Equipment Used: Other (comment) (RW) Transfers/Ambulation Related to ADLs: cues for safe hand plaacement.  Pt.  kyphotic/flexed in moving sit<>stand. Cues for body position inside of walker    OT Diagnosis: Generalized weakness  OT Problem List: Decreased strength;Decreased knowledge of use of DME or AE;Decreased knowledge of precautions;Decreased safety awareness;Impaired balance (sitting and/or standing) OT Treatment Interventions: Self-care/ADL training;Balance training;Therapeutic exercise;Neuromuscular education;Therapeutic activities;DME and/or AE instruction;Patient/family education   OT Goals Acute Rehab OT Goals OT Goal Formulation: With patient/family Time For Goal Achievement: 08/04/12 Potential to Achieve Goals: Good ADL Goals Pt Will Perform Grooming: with set-up;with supervision;Standing at sink ADL Goal: Grooming - Progress: Goal set today Pt Will Perform Lower Body Bathing: with set-up;with supervision ADL Goal: Lower Body Bathing - Progress: Goal set today Pt Will Perform Lower Body Dressing: with set-up;with supervision ADL Goal: Lower Body Dressing - Progress: Goal set today Pt Will Transfer to Toilet: with supervision;with DME ADL Goal: Toilet Transfer - Progress: Goal set today Pt Will Perform Toileting - Clothing Manipulation: with supervision ADL Goal: Toileting - Clothing Manipulation - Progress: Goal set today Pt Will Perform Toileting - Hygiene: with supervision ADL Goal: Toileting - Hygiene - Progress: Goal set today  Visit Information  Last OT Received On: 07/28/12 Assistance Needed: +1 PT/OT Co-Evaluation/Treatment: Yes    Subjective Data  Subjective: " I am doing pretty good " Patient Stated Goal: To return home   Prior Functioning     Home Living Lives With: Daughter;Other (Comment) Available Help at Discharge: Family;Available 24 hours/day Type of Home: House Home Access: Stairs to enter [employer] of Steps: 2 Entrance Stairs-Rails: Left Home Layout: One level Bathroom Shower/Tub: Health visitor: Standard Bathroom  Accessibility: Yes How Accessible: Accessible via walker Home Adaptive Equipment: Shower chair with back;Hand-held shower hose;Grab bars in shower;Walker - rolling Prior Function Level of Independence: Independent Able to Take Stairs?: Yes Driving: No Vocation: Retired Musician: No difficulties Dominant Hand: Right  Vision/Perception Vision - History Baseline Vision: Wears glasses all the time Patient Visual Report: No change from baseline Perception Perception: Within Functional Limits   Cognition  Cognition Overall Cognitive Status: Appears within functional limits for tasks assessed/performed Arousal/Alertness: Awake/alert Orientation Level: Appears intact for tasks assessed Behavior During Session: [facility] for tasks performed    Extremity/Trunk Assessment Right Upper Extremity Assessment RUE ROM/Strength/Tone: [facility] for tasks assessed Left Upper Extremity Assessment LUE ROM/Strength/Tone: WFL for tasks assessed Right Lower Extremity Assessment RLE ROM/Strength/Tone: WFL for tasks assessed RLE Sensation: WFL - Light Touch RLE Coordination: WFL - gross motor Left Lower Extremity Assessment LLE ROM/Strength/Tone: WFL for tasks assessed LLE Sensation: WFL - Light Touch LLE Coordination: WFL - gross motor Trunk Assessment Trunk Assessment: Kyphotic     Mobility Bed Mobility Bed Mobility: Not assessed Transfers Sit to Stand: 4: Min assist;From bed;From toilet;With upper extremity assist Stand to Sit: 4: Min guard;To bed;To toilet;With upper extremity assist Details for Transfer Assistance: cues for safe hand plaacement.  Pt. kyphotic/flexed in moving sit<>stand.     Exercise     Balance Balance Balance Assessed: Yes Static Sitting Balance Static Sitting - Balance Support: No upper extremity supported;Feet supported Dynamic Sitting Balance Dynamic Sitting - Balance Support: Right upper extremity supported;During functional  activity Dynamic Sitting - Level of Assistance: 4: Min assist Static Standing Balance Static Standing - Balance Support: During functional activity Static Standing - Level of Assistance: 4: Min assist   End of Session OT - End of Session Equipment Utilized During Treatment: Gait belt (RW) Activity Tolerance: Patient tolerated treatment well Patient left: in bed;with call bell/phone within reach;with family/visitor present;Other (comment) (sitting EOB eating )  GO Functional Limitation: Self care Self Care Current Status (A5409): At least 20 percent but less than 40 percent impaired, limited or restricted Self Care Goal Status (W1191): At least 1 percent but less than 20 percent impaired, limited or restricted   Galen Manila 07/28/2012, 4:10 PM

## 2012-07-28 NOTE — Progress Notes (Signed)
Pt back from MRI  Pt pleasant, cooperative, responds inappropriately at times to situatuions. Wife at bedside.  Pt incontinent of bowel and bladder.

## 2012-07-28 NOTE — Progress Notes (Signed)
*  PRELIMINARY RESULTS* Vascular Ultrasound Carotid Duplex (Doppler) has been completed.  Preliminary findings: Bilateral ICA: no stenosis. Bilateral Vertebrals: antegrade flow.   Casey Whitney FRANCES 07/28/2012, 1:49 PM

## 2012-07-28 NOTE — Evaluation (Signed)
Physical Therapy Evaluation Patient Details Name: Casey Whitney MRN: [id number] DOB: 1922/02/07 Today's Date: 07/28/2012 Time: 4098-1191 PT Time Calculation (min): 31 min  PT Assessment / Plan / Recommendation Clinical Impression  Pt. was admitted with sudden confusion, slurred speech, bilateral hand shaking and weakness.  Being worked up for possible TIA, lightheadedness vs. presyncope and mild dehydration.  MRI results pending.  He is somewhat unsteady on his feet,  and at this time needs acute PT and the use of RW. Wife and 2 daughters present and they were educated on the need for 24 hour assist/supervision until cleared by HHPT.      PT Assessment  Patient needs continued PT services    Follow Up Recommendations  Home health PT;Supervision/Assistance - 24 hour;Supervision for mobility/OOB    Does the patient have the potential to tolerate intense rehabilitation      Barriers to Discharge None      Equipment Recommendations  None recommended by PT;Other (comment) (family reports RW in the home for pt. use)    Recommendations for Other Services     Frequency Min 4X/week    Precautions / Restrictions Precautions Precautions: Fall Restrictions Weight Bearing Restrictions: No   Pertinent Vitals/Pain no apparent distress        Mobility  Bed Mobility Bed Mobility: Not assessed (pt. seated at EOB upon PT entry into room) Transfers Transfers: Sit to Stand;Stand to Sit Sit to Stand: 4: Min assist;From bed;From toilet;With upper extremity assist Stand to Sit: 4: Min guard;To bed;To toilet;With upper extremity assist Details for Transfer Assistance: cues for safe hand plaacement.  Pt. kyphotic/flexed in moving sit<>stand. Ambulation/Gait Ambulation/Gait Assistance: 4: Min assist Ambulation Distance (Feet): 14 Feet (7' x 2) Assistive device: None;Rolling walker Ambulation/Gait Assistance Details: Pt. was trialed without device since he doesn't often use one, however he  demonstrated unsteadiness and decreased balance, needing min assist for safety/stability/balance.  Pt. more balanced wtih use of RW, still at min assist level but approaching min guard.   Gait Pattern: Step-through pattern;Decreased step length - right;Decreased step length - left;Trunk flexed;Shuffle Gait velocity: slow Stairs: No    Exercises     PT Diagnosis: Difficulty walking;Generalized weakness  PT Problem List: Decreased activity tolerance;Decreased balance;Decreased mobility;Decreased knowledge of use of DME PT Treatment Interventions: DME instruction;Gait training;Stair training;Functional mobility training;Therapeutic activities;Therapeutic exercise;Balance training;Patient/family education   PT Goals Acute Rehab PT Goals PT Goal Formulation: With patient Time For Goal Achievement: 08/04/12 Potential to Achieve Goals: Good Pt will go Supine/Side to Sit: with supervision PT Goal: Supine/Side to Sit - Progress: Goal set today Pt will go Sit to Supine/Side: with supervision PT Goal: Sit to Supine/Side - Progress: Goal set today Pt will go Sit to Stand: with supervision PT Goal: Sit to Stand - Progress: Goal set today Pt will go Stand to Sit: with supervision PT Goal: Stand to Sit - Progress: Goal set today Pt will Transfer Bed to Chair/Chair to Bed: with supervision PT Transfer Goal: Bed to Chair/Chair to Bed - Progress: Goal set today Pt will Ambulate: >150 feet;with supervision;with least restrictive assistive device PT Goal: Ambulate - Progress: Goal set today Pt will Go Up / Down Stairs: 1-2 stairs;with rail(s);with min assist PT Goal: Up/Down Stairs - Progress: Goal set today  Visit Information  Last PT Received On: 07/28/12 Assistance Needed: +1 PT/OT Co-Evaluation/Treatment: Yes    Subjective Data  Subjective: Pt. presents sitting at EOB, eating lunch with wife and 2 daughters in room. Patient Stated Goal: return home with  family support   Prior Functioning   Home Living Lives With: Daughter;Other (Comment) (Charlene) Available Help at Discharge: Family;Available 24 hours/day (between two daughters) Type of Home: House Home Access: Stairs to enter [employer] of Steps: 2 Entrance Stairs-Rails: Left Home Layout: One level Bathroom Shower/Tub: Health visitor: Standard Bathroom Accessibility: Yes How Accessible: Accessible via walker Home Adaptive Equipment: Shower chair with back;Hand-held shower hose;Grab bars in shower;Walker - rolling Prior Function Level of Independence: Independent Able to Take Stairs?: Yes Driving: No Vocation: Retired Musician: No difficulties Dominant Hand: Right    Cognition  Cognition Overall Cognitive Status: Appears within functional limits for tasks assessed/performed Arousal/Alertness: Awake/alert Orientation Level: Appears intact for tasks assessed Behavior During Session: [facility] for tasks performed    Extremity/Trunk Assessment Right Upper Extremity Assessment RUE ROM/Strength/Tone: [facility] for tasks assessed Left Upper Extremity Assessment LUE ROM/Strength/Tone: WFL for tasks assessed Right Lower Extremity Assessment RLE ROM/Strength/Tone: WFL for tasks assessed RLE Sensation: WFL - Light Touch RLE Coordination: WFL - gross motor Left Lower Extremity Assessment LLE ROM/Strength/Tone: WFL for tasks assessed LLE Sensation: WFL - Light Touch LLE Coordination: WFL - gross motor Trunk Assessment Trunk Assessment: Kyphotic   Balance Balance Balance Assessed: Yes Static Sitting Balance Static Sitting - Balance Support: No upper extremity supported;Feet supported Dynamic Sitting Balance Dynamic Sitting - Balance Support: Right upper extremity supported;During functional activity Dynamic Sitting - Level of Assistance: 4: Min assist Static Standing Balance Static Standing - Balance Support: During functional activity Static Standing - Level of Assistance: 4:  Min assist  End of Session PT - End of Session Equipment Utilized During Treatment: Gait belt Activity Tolerance: Patient tolerated treatment well Patient left: in bed;with call bell/phone within reach;with family/visitor present Nurse Communication: Mobility status  GP Functional Assessment Tool Used: clinical judgement Functional Limitation: Mobility: Walking and moving around Mobility: Walking and Moving Around Current Status (Z6109): At least 20 percent but less than 40 percent impaired, limited or restricted Mobility: Walking and Moving Around Goal Status [phone]): At least 1 percent but less than 20 percent impaired, limited or restricted   Ferman Hamming 07/28/2012, 3:11 PM Weldon Picking PT Acute Rehab Services [phone] Beeper [phone]

## 2012-07-28 NOTE — Progress Notes (Signed)
Inpatient Diabetes Program Recommendations  AACE/ADA: New Consensus Statement on Inpatient Glycemic Control (2013)  Target Ranges:  Prepandial:   less than 140 mg/dL      Peak postprandial:   less than 180 mg/dL (1-2 hours)      Critically ill patients:  140 - 180 mg/dL   Results for JAVAUGHN, OPDAHL (MRN [id number]) as of 07/28/2012 10:34  Ref. Range 07/27/2012 22:07 07/28/2012 01:39 07/28/2012 06:47  Glucose-Capillary Latest Range: 70-99 mg/dL 409 (H) 811 (H) 914 (H)    Inpatient Diabetes Program Recommendations Correction (SSI): If patient is eating well and tolerating diet without any problems, please consider changing frequency of CBGs and Novolog correction to ACHS.  Note: Patient has a history of diabetes and takes Amarly 2mg  QAM at home for diabetes management.  Currently, patient is ordered to receive Novolog sensitive correction Q4H for inpatient glycemic control.  If patient is eating well and tolerating diet without any problems, please consider changing frequency of CBGs and Novolog correction to ACHS.  Will continue to follow.  Thanks, Orlando Penner, RN, BSN, CCRN Diabetes Coordinator Inpatient Diabetes Program [phone]

## 2012-07-29 LAB — CBC
HCT: 35 % — ABNORMAL LOW (ref 39.0–52.0)
MCH: 30.2 pg (ref 26.0–34.0)
MCHC: 34.6 g/dL (ref 30.0–36.0)
MCV: 87.3 fL (ref 78.0–100.0)
Platelets: 170 10*3/uL (ref 150–400)
RDW: 14.6 % (ref 11.5–15.5)

## 2012-07-29 LAB — BASIC METABOLIC PANEL
BUN: 24 mg/dL — ABNORMAL HIGH (ref 6–23)
CO2: 25 mEq/L (ref 19–32)
Calcium: 9.6 mg/dL (ref 8.4–10.5)
Creatinine, Ser: 1.37 mg/dL — ABNORMAL HIGH (ref 0.50–1.35)
Glucose, Bld: 118 mg/dL — ABNORMAL HIGH (ref 70–99)

## 2012-07-29 LAB — GLUCOSE, CAPILLARY: Glucose-Capillary: 155 mg/dL — ABNORMAL HIGH (ref 70–99)

## 2012-07-29 MED ORDER — ASPIRIN 325 MG PO TABS: 325.0000 mg | ORAL_TABLET | Freq: Every day | ORAL | Status: AC

## 2012-07-29 NOTE — Progress Notes (Signed)
Physical Therapy Treatment Patient Details Name: Levonte Molina MRN: [id number] DOB: 1922-02-25 Today's Date: 07/29/2012 Time: 4098-1191 PT Time Calculation (min): 23 min  PT Assessment / Plan / Recommendation Comments on Treatment Session  Pt. making good gains with his gait and transfers, though needs safety reminders at times.  Family educated that he currently needs 24 hour assist/supervision until cleared by HHPT and that he needs to use his RW .    Follow Up Recommendations  Home health PT;Supervision/Assistance - 24 hour;Supervision for mobility/OOB     Does the patient have the potential to tolerate intense rehabilitation     Barriers to Discharge        Equipment Recommendations  None recommended by PT;Other (comment)    Recommendations for Other Services    Frequency Min 4X/week   Plan Discharge plan remains appropriate    Precautions / Restrictions Precautions Precautions: Fall Restrictions Weight Bearing Restrictions: No   Pertinent Vitals/Pain No distress, no pain    Mobility  Bed Mobility Bed Mobility: Not assessed Transfers Transfers: Sit to Stand;Stand to Sit Sit to Stand: 4: Min guard;From toilet;With upper extremity assist;From chair/3-in-1 Stand to Sit: 4: Min guard;With upper extremity assist;To chair/3-in-1 Details for Transfer Assistance: cues for safety and hand placement Ambulation/Gait Ambulation/Gait Assistance: 4: Min guard Ambulation Distance (Feet): 60 Feet Assistive device: Rolling walker Ambulation/Gait Assistance Details: needs safety cues to keep walker with him until backed up to surface he intends to sit on, tends to "park" RW off to the side before approaching chair. Gait Pattern: Step-through pattern;Decreased step length - right;Decreased step length - left;Trunk flexed;Shuffle Gait velocity: slow Stairs: No    Exercises     PT Diagnosis:    PT Problem List:   PT Treatment Interventions:     PT Goals Acute Rehab PT  Goals Pt will go Sit to Stand: with supervision PT Goal: Sit to Stand - Progress: Progressing toward goal Pt will go Stand to Sit: with supervision PT Goal: Stand to Sit - Progress: Progressing toward goal Pt will Ambulate: >150 feet;with supervision;with least restrictive assistive device PT Goal: Ambulate - Progress: Progressing toward goal  Visit Information  Last PT Received On: 07/29/12 Assistance Needed: +1    Subjective Data  Subjective: Pt. presents on toilet, agreeable to functional mobility with PT   Cognition  Cognition Overall Cognitive Status: Appears within functional limits for tasks assessed/performed Arousal/Alertness: Awake/alert Orientation Level: Appears intact for tasks assessed Behavior During Session: [facility] for tasks performed    Balance     End of Session PT - End of Session Equipment Utilized During Treatment: Gait belt Activity Tolerance: Patient tolerated treatment well Patient left: in chair;with call bell/phone within reach;with family/visitor present Nurse Communication: Mobility status   GP Functional Assessment Tool Used: clinical judgement Functional Limitation: Mobility: Walking and moving around Mobility: Walking and Moving Around Discharge Status [phone]): At least 20 percent but less than 40 percent impaired, limited or restricted   Ferman Hamming 07/29/2012, 1:39 PM Weldon Picking PT Acute Rehab Services [phone] Beeper [phone]

## 2012-07-29 NOTE — Progress Notes (Signed)
Occupational Therapy Treatment Patient Details Name: Casey Whitney MRN: [id number] DOB: 03-May-1922 Today's Date: 07/29/2012 Time: 1191-4782 OT Time Calculation (min): 21 min  OT Assessment / Plan / Recommendation Comments on Treatment Session Pt making progress and doing well, pt scheduled to d/c home with family today. Pt will have 24 hr supervison/assist prn, HH    Follow Up Recommendations  Supervision/Assistance - 24 hour;Home health OT    Barriers to Discharge   None    Equipment Recommendations  None recommended by OT    Recommendations for Other Services    Frequency     Plan Discharge plan remains appropriate    Precautions / Restrictions Precautions Precautions: Fall Restrictions Weight Bearing Restrictions: No   Pertinent Vitals/Pain     ADL  Grooming: Performed;Wash/dry hands;Wash/dry face;Min guard Where Assessed - Grooming: Unsupported standing Lower Body Bathing: Performed;Minimal assistance;Min guard Where Assessed - Lower Body Bathing: Supported standing Upper Body Dressing: Performed;Supervision/safety;Set up Lower Body Dressing: Performed;Min guard Where Assessed - Lower Body Dressing: Unsupported sitting;Supported sit to stand Toilet Transfer: Min Pension scheme manager Method: Sit to Barista: Regular height toilet;Grab bars Where Assessed - Toileting Clothing Manipulation and Hygiene: Standing    OT Diagnosis:    OT Problem List:   OT Treatment Interventions:     OT Goals ADL Goals ADL Goal: Grooming - Progress: Progressing toward goals ADL Goal: Lower Body Bathing - Progress: Progressing toward goals ADL Goal: Lower Body Dressing - Progress: Progressing toward goals ADL Goal: Toilet Transfer - Progress: Progressing toward goals ADL Goal: Toileting - Clothing Manipulation - Progress: Progressing toward goals ADL Goal: Toileting - Hygiene - Progress: Progressing toward goals  Visit Information  Last OT Received On:  07/29/12 Assistance Needed: +1 PT/OT Co-Evaluation/Treatment: Yes    Subjective Data  Subjective: " I hope to go home today " Patient Stated Goal: To return home with family   Prior Functioning       Cognition  Cognition Overall Cognitive Status: Appears within functional limits for tasks assessed/performed Arousal/Alertness: Awake/alert Orientation Level: Appears intact for tasks assessed Behavior During Session: [facility] for tasks performed    Mobility  Bed Mobility Bed Mobility: Not assessed Transfers Sit to Stand: 4: Min guard;From toilet;With upper extremity assist;From chair/3-in-1 Stand to Sit: 4: Min guard;With upper extremity assist;To chair/3-in-1 Details for Transfer Assistance: cues for safety and hand placement    Exercises      Balance Balance Balance Assessed: Yes Static Sitting Balance Static Sitting - Balance Support: No upper extremity supported;Feet supported Dynamic Sitting Balance Dynamic Sitting - Balance Support: Right upper extremity supported;During functional activity Dynamic Sitting - Level of Assistance: 4: Min assist Static Standing Balance Static Standing - Balance Support: During functional activity Static Standing - Level of Assistance: 4: Min assist   End of Session OT - End of Session Equipment Utilized During Treatment: Gait belt;Other (comment) (RW) Activity Tolerance: Patient tolerated treatment well Patient left: in chair;with call bell/phone within reach;with family/visitor present  GO Functional Limitation: Self care Self Care Current Status (N5621): At least 1 percent but less than 20 percent impaired, limited or restricted Self Care Goal Status (H0865): At least 1 percent but less than 20 percent impaired, limited or restricted Self Care Discharge Status [phone]): At least 1 percent but less than 20 percent impaired, limited or restricted   Galen Manila 07/29/2012, 2:36 PM

## 2012-07-29 NOTE — Discharge Summary (Signed)
Physician Discharge Summary  Casey Whitney:[id number] DOB: [birth year] DOA: 07/27/2012  PCP: No primary provider on file.  Admit date: 07/27/2012 Discharge date: 07/29/2012  Time spent: 40 minutes  Recommendations for Outpatient Follow-up:  1. Please follow with your PCP in 1-2 weeks.    Discharge Diagnoses:  Active Problems:   TIA (transient ischemic attack)   DM (diabetes mellitus)   HTN (hypertension)   Lightheadedness  Discharge Condition: stable  Diet recommendation: heart healthy  Filed Weights   07/28/12 0111  Weight: 68.6 kg (151 lb 3.8 oz)   History of present illness:  Casey Whitney is a 79 y.o. male  has a past medical history of Hyperlipidemia; Dementia; Vertigo; Benign prostatic hypertrophy; Diabetes mellitus without complication; and Hypertension. Presented with  He developed sudden confusion had slurred speech and started to have bilateral hand shaking. He was generally so weak he could hardly keep his head up but did not lose consciousness. His wife gave him some some bread and butter that he has vomited shortly thereafter. He could not stand up and EMS was called. Once EMS arrived he rapidly improved. His blood sugar was slightly elevated in 200's. His blood pressure was noted to be 170s over 80s. By EMS evaluation he seemed he had generalized weakness and no localized symptoms. Per wife he told her he was a bit weker on the left side but this has not been confirmed by anybody exam. On arrival to ER patient is back to baseline. He did have a low-grade fever up to 99.7 but there was no evidence of infection. Per wife he had not had fever at home. Chest x-ray did not show any evidence of infiltrate or pneumonia. Per his wife he has been coughing but not coughing up any phlegm. Family states he have had some low blood sugars but they're not sure if the blood sugar monitor is accurate. We have held his Amaryl because on occasion his blood sugars drop. [facility]  called on admission of evaluation of possible TIA versus presyncope  Hospital Course:   Possible TIA - given slurred speech and questionable left sided weakness. 2D echo, MRI and carotid dopplers done (full read below). Patient has had episodes of hypoglycemia in the past secondary to his diabetes medications and this is most likely the explanation for his symptoms. The MRI showed moderate stenosis of the distal left M1 segment. I curbsided Neurology and discussed with them about the stenosis and given the fact that it is quite distal it would not be amenable to intervention. Patient is to be discharged on full dose aspirin.  DM (diabetes mellitus) - in the light of possible hypoglycemic episode, I discontinued his glimepiride on discharge. His HBA1C was elevated and I advised patient to follow up with his PCP in 1-2 weeks to discuss treatment options in regards to his diabetes. He will likely need to start insulin soon.  HTN (hypertension) - continue home medications, no changes made this hospitalization.   Procedures:  2D echo Left ventricle: The cavity size was normal. Systolic function was normal. The estimated ejection fraction was in the range of 60% to 65%. Wall motion was normal; there were no regional wall motion abnormalities. Doppler parameters are consistent with abnormal left ventricular relaxation (grade 1 diastolic dysfunction).   Doppler carotid  Summary: No significant extracranial carotid artery stenosis demonstrated. Vertebrals are patent with antegrade flow.  Consultations:  none  Discharge Exam: Filed Vitals:   07/28/12 2200 07/28/12 2300 07/29/12 0200 07/29/12 0600  BP:  120/55 131/62 135/58  Pulse:  78 65 73  Temp: 98.2 F (36.8 C)  97.4 F (36.3 C) 98.1 F (36.7 C)  TempSrc:      Resp:  20 20 20   Height:      Weight:      SpO2:  90% 93% 92%   General: NAD Cardiovascular: RRR without MRG Respiratory: CTA biL  Discharge Instructions     Medication List    STOP taking these medications       glimepiride 2 MG tablet  Commonly known as:  AMARYL      TAKE these medications       amLODipine 5 MG tablet  Commonly known as:  NORVASC  Take 5 mg by mouth every evening.     aspirin 325 MG tablet  Take 1 tablet (325 mg total) by mouth daily.     benazepril 20 MG tablet  Commonly known as:  LOTENSIN  Take 20 mg by mouth daily.     donepezil 10 MG tablet  Commonly known as:  ARICEPT  Take 10 mg by mouth at bedtime.     ferrous sulfate 325 (65 FE) MG tablet  Take 325 mg by mouth daily with breakfast.     fish oil-omega-3 fatty acids 1000 MG capsule  Take 1 g by mouth 2 (two) times daily.     memantine 10 MG tablet  Commonly known as:  NAMENDA  Take 10 mg by mouth 2 (two) times daily.     menthol-zinc oxide powder  Apply 1 application topically every 7 (seven) days. As needed for foot fungus     oxybutynin 10 MG 24 hr tablet  Commonly known as:  DITROPAN-XL  Take 10 mg by mouth daily.     pravastatin 40 MG tablet  Commonly known as:  PRAVACHOL  Take 40 mg by mouth daily.     tolterodine 2 MG 24 hr capsule  Commonly known as:  DETROL LA  Take 2 mg by mouth daily.        The results of significant diagnostics from this hospitalization (including imaging, microbiology, ancillary and laboratory) are listed below for reference.    Significant Diagnostic Studies: Dg Chest 2 View  07/27/2012  *RADIOLOGY REPORT*  Clinical Data: Altered mental status.  CHEST - 2 VIEW  Comparison: None.  Findings: The cardiac silhouette, mediastinal and hilar contours are within normal limits for age.  The lungs are clear.  No pleural effusion.  Chronic-appearing bronchitic lung changes.  Colonic interposition noted with bowel on the right hemidiaphragm.  The bony thorax is intact.  IMPRESSION:  1.  Chronic bronchitic type lung changes. 2.  No acute cardiopulmonary findings.   Original Report Authenticated By: Rudie Meyer, M.D.     Ct Head Wo Contrast  07/27/2012  *RADIOLOGY REPORT*  Clinical Data: Altered mental status.  CT HEAD WITHOUT CONTRAST  Technique:  Contiguous axial images were obtained from the base of the skull through the vertex without contrast.  Comparison: None  Findings: Age related cerebral atrophy, ventriculomegaly and periventricular white matter disease.  No extra-axial fluid collections are identified.  No CT findings for acute hemispheric infarction and/or intracranial hemorrhage.  No mass lesions.  The brainstem and cerebellum grossly normal.  Extensive vascular calcifications are noted.  The bony structures are intact. The right frontal sinus is opacified.  There is scattered ethmoid disease also.  Probable chronic mastoid effusions.  IMPRESSION:  1.  Age related cerebral atrophy, ventriculomegaly and periventricular  white matter disease. 2.  No acute intracranial findings or mass lesions. 3.  Scattered sinus disease.   Original Report Authenticated By: Rudie Meyer, M.D.    Mri Brain Without Contrast  07/28/2012  *RADIOLOGY REPORT*  Clinical Data:  Sudden onset confusion and slurred speech. Bilateral hand shaking.  MRI HEAD WITHOUT CONTRAST MRA HEAD WITHOUT CONTRAST  Technique:  Multiplanar, multiecho pulse sequences of the brain and surrounding structures were obtained without intravenous contrast. Angiographic images of the head were obtained using MRA technique without contrast.  Comparison:  CT head without contrast 07/27/2012.  MRI HEAD  Findings:  The diffusion weighted images demonstrate no evidence for acute or subacute infarction.  Moderate atrophy and extensive white matter disease is present bilaterally.  Remote lacunar infarcts are present in the basal ganglia bilaterally as well as the right cerebellum.  Flow is present in the major intracranial arteries.  The right frontal sinus is chronically opacified.  Mild mucosal thickening is present in the maxillary sinuses bilaterally.  There is some  fluid in the mastoid air cells on the right.  No obstructing nasopharyngeal lesion is evident.  IMPRESSION: 1.  No acute intracranial abnormality. 2.  Moderate to advanced diffuse atrophy white matter disease. This likely reflects the sequelae of chronic microvascular ischemia. 3.  Chronic opacification of the right frontal sinus.  MRA HEAD  Findings: The internal carotid arteries are within normal limits from high cervical segments through the ICA termini bilaterally. The A1 segments are normal bilaterally.  The right M1 segment is normal.  There is a moderate high-grade focal stenosis of the distal left M1 segment, just proximal to the MCA trifurcation. Mild small vessel attenuation is present in the MCA branch vessels bilaterally.  The right vertebral artery is the dominant vessel.  The right PICA and left AICA origins are visualized and normal.  Both posterior cerebral arteries originate from basilar tip.  There is some attenuation of distal PCA branch vessels bilaterally.  IMPRESSION:  1.  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 2.  Mild small vessel disease.   Original Report Authenticated By: Marin Roberts, M.D.    Mr Mra Head/brain Wo Cm  07/28/2012  *RADIOLOGY REPORT*  Clinical Data:  Sudden onset confusion and slurred speech. Bilateral hand shaking.  MRI HEAD WITHOUT CONTRAST MRA HEAD WITHOUT CONTRAST  Technique:  Multiplanar, multiecho pulse sequences of the brain and surrounding structures were obtained without intravenous contrast. Angiographic images of the head were obtained using MRA technique without contrast.  Comparison:  CT head without contrast 07/27/2012.  MRI HEAD  Findings:  The diffusion weighted images demonstrate no evidence for acute or subacute infarction.  Moderate atrophy and extensive white matter disease is present bilaterally.  Remote lacunar infarcts are present in the basal ganglia bilaterally as well as the right cerebellum.  Flow is present in the major  intracranial arteries.  The right frontal sinus is chronically opacified.  Mild mucosal thickening is present in the maxillary sinuses bilaterally.  There is some fluid in the mastoid air cells on the right.  No obstructing nasopharyngeal lesion is evident.  IMPRESSION: 1.  No acute intracranial abnormality. 2.  Moderate to advanced diffuse atrophy white matter disease. This likely reflects the sequelae of chronic microvascular ischemia. 3.  Chronic opacification of the right frontal sinus.  MRA HEAD  Findings: The internal carotid arteries are within normal limits from high cervical segments through the ICA termini bilaterally. The A1 segments are normal bilaterally.  The right  M1 segment is normal.  There is a moderate high-grade focal stenosis of the distal left M1 segment, just proximal to the MCA trifurcation. Mild small vessel attenuation is present in the MCA branch vessels bilaterally.  The right vertebral artery is the dominant vessel.  The right PICA and left AICA origins are visualized and normal.  Both posterior cerebral arteries originate from basilar tip.  There is some attenuation of distal PCA branch vessels bilaterally.  IMPRESSION:  1.  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 2.  Mild small vessel disease.   Original Report Authenticated By: Marin Roberts, M.D.    Labs: Basic Metabolic Panel:  Recent Labs Lab 07/27/12 2125 07/28/12 1050 07/29/12 0440  NA 135 135 137  K 4.5 4.5 4.7  CL 100 100 101  CO2 23 28 25   GLUCOSE 225* 237* 118*  BUN 29* 26* 24*  CREATININE 1.51* 1.47* 1.37*  CALCIUM 9.5 9.1 9.6   Liver Function Tests:  Recent Labs Lab 07/27/12 2125  AST 19  ALT 17  ALKPHOS 53  BILITOT 0.4  PROT 6.6  ALBUMIN 3.6   CBC:  Recent Labs Lab 07/27/12 2125 07/29/12 0440  WBC 11.8* 11.9*  NEUTROABS 10.4*  --   HGB 12.6* 12.1*  HCT 36.8* 35.0*  MCV 89.8 87.3  PLT 184 170   Cardiac Enzymes:  Recent Labs Lab 07/27/12 2124  TROPONINI <0.30    CBG:  Recent Labs Lab 07/28/12 1606 07/28/12 2125 07/28/12 2230 07/29/12 0006 07/29/12 0409  GLUCAP 178* 126* 155* 180* 115*     Signed:  Patrese Neal  [facility] 07/29/2012, 8:03 AM

## 2012-07-29 NOTE — Progress Notes (Signed)
Iv d/c. Discharged instructions given. Pt verbalized understanding.

## 2012-07-29 NOTE — Care Management Note (Signed)
    Page 1 of 1   07/29/2012     1:24:19 PM   CARE MANAGEMENT NOTE 07/29/2012  Patient:  Casey Whitney, Casey Whitney   Account Number:  [id number]  Date Initiated:  07/28/2012  Documentation initiated by:  [facility]  Subjective/Objective Assessment:   admitted with confusion, slurred speech, TIA workup     Action/Plan:   PT/OT evals-recommending HHPT and HHOT   Anticipated DC Date:  07/29/2012   Anticipated DC Plan:  HOME W HOME HEALTH SERVICES      DC Planning Services  CM consult      Choice offered to / List presented to:  C-3 Spouse        HH arranged  HH-2 PT  HH-3 OT      [facility] agency  [facility]   Status of service:  Completed, signed off Medicare Important Message given?   (If response is "NO", the following Medicare IM given date fields will be blank) Date Medicare IM given:   Date Additional Medicare IM given:    Discharge Disposition:  HOME W HOME HEALTH SERVICES  Per UR Regulation:  Reviewed for med. necessity/level of care/duration of stay  If discussed at Long Length of Stay Meetings, dates discussed:    Comments:  07/29/12 Received permission from patient to speak with wife and daughter about HHC. They chose [facility] from [facility] list. D[facility] N[facility]jar at [facility] set up HHPT and HHOT. Patient has rolling walker at home, no equipment needs identified. Patient lives with wife, they have an daily and 2 daughters who are with them or near by 24/7. Natasha Mead, BSN, CCM

## 2012-09-15 NOTE — Progress Notes (Signed)
PT treatment and discharge note - addendum Late entry for g-codes   August 27, 2012 1331  PT G-Codes **NOT FOR INPATIENT CLASS**  Functional Assessment Tool Used clinical judgement  Functional Limitation Mobility: Walking and moving around  Mobility: Walking and Moving Around Goal Status [phone]) CI  Mobility: Walking and Moving Around Discharge Status [phone]) CJ  PT General Charges  $$ ACUTE PT VISIT 1 Procedure  PT Treatments  $Gait Training 8-22 mins  $Therapeutic Activity 8-22 mins   09/15/2012 [place], PT [phone]

## 2012-09-16 NOTE — Progress Notes (Signed)
OT NOTE  LAte entry   07/29/12 1435  OT G-codes **NOT FOR INPATIENT CLASS**  Functional Limitation Self care  Self Care Goal Status (B1478) CI  Self Care Discharge Status [phone]) CI  OT General Charges  $OT Visit 1 Procedure  OT Treatments  $Self Care/Home Management  8-22 mins     Harrel Carina Garrett   OTR/L Pager: [phone] Office: [phone] . Signed by Jiles Harold for Margaretmary Eddy

## 2013-04-20 ENCOUNTER — Emergency Department (HOSPITAL_BASED_OUTPATIENT_CLINIC_OR_DEPARTMENT_OTHER): Payer: Medicare Other

## 2013-04-20 ENCOUNTER — Encounter (HOSPITAL_BASED_OUTPATIENT_CLINIC_OR_DEPARTMENT_OTHER): Payer: Self-pay | Admitting: Emergency Medicine

## 2013-04-20 ENCOUNTER — Inpatient Hospital Stay (HOSPITAL_BASED_OUTPATIENT_CLINIC_OR_DEPARTMENT_OTHER)
Admission: EM | Admit: 2013-04-20 | Discharge: 2013-04-25 | DRG: 193 | Disposition: A | Discharge status: Home Health | Payer: Medicare Other | Attending: Internal Medicine | Admitting: Internal Medicine

## 2013-04-20 DIAGNOSIS — N183 Chronic kidney disease, stage 3 unspecified: Secondary | ICD-10-CM | POA: Diagnosis present

## 2013-04-20 DIAGNOSIS — Z8673 Personal history of transient ischemic attack (TIA), and cerebral infarction without residual deficits: Secondary | ICD-10-CM

## 2013-04-20 DIAGNOSIS — E119 Type 2 diabetes mellitus without complications: Secondary | ICD-10-CM

## 2013-04-20 DIAGNOSIS — E785 Hyperlipidemia, unspecified: Secondary | ICD-10-CM | POA: Diagnosis present

## 2013-04-20 DIAGNOSIS — Z8546 Personal history of malignant neoplasm of prostate: Secondary | ICD-10-CM

## 2013-04-20 DIAGNOSIS — J9600 Acute respiratory failure, unspecified whether with hypoxia or hypercapnia: Secondary | ICD-10-CM

## 2013-04-20 DIAGNOSIS — F0390 Unspecified dementia without behavioral disturbance: Secondary | ICD-10-CM | POA: Diagnosis present

## 2013-04-20 DIAGNOSIS — G459 Transient cerebral ischemic attack, unspecified: Secondary | ICD-10-CM

## 2013-04-20 DIAGNOSIS — J1100 Influenza due to unidentified influenza virus with unspecified type of pneumonia: Principal | ICD-10-CM | POA: Diagnosis present

## 2013-04-20 DIAGNOSIS — N189 Chronic kidney disease, unspecified: Secondary | ICD-10-CM

## 2013-04-20 DIAGNOSIS — R739 Hyperglycemia, unspecified: Secondary | ICD-10-CM

## 2013-04-20 DIAGNOSIS — R404 Transient alteration of awareness: Secondary | ICD-10-CM | POA: Diagnosis not present

## 2013-04-20 DIAGNOSIS — I129 Hypertensive chronic kidney disease with stage 1 through stage 4 chronic kidney disease, or unspecified chronic kidney disease: Secondary | ICD-10-CM | POA: Diagnosis present

## 2013-04-20 DIAGNOSIS — Z66 Do not resuscitate: Secondary | ICD-10-CM | POA: Diagnosis present

## 2013-04-20 DIAGNOSIS — R471 Dysarthria and anarthria: Secondary | ICD-10-CM | POA: Diagnosis present

## 2013-04-20 DIAGNOSIS — R42 Dizziness and giddiness: Secondary | ICD-10-CM

## 2013-04-20 DIAGNOSIS — Z7982 Long term (current) use of aspirin: Secondary | ICD-10-CM

## 2013-04-20 DIAGNOSIS — J189 Pneumonia, unspecified organism: Secondary | ICD-10-CM

## 2013-04-20 DIAGNOSIS — R531 Weakness: Secondary | ICD-10-CM

## 2013-04-20 DIAGNOSIS — I10 Essential (primary) hypertension: Secondary | ICD-10-CM

## 2013-04-20 DIAGNOSIS — R5381 Other malaise: Secondary | ICD-10-CM

## 2013-04-20 DIAGNOSIS — E860 Dehydration: Secondary | ICD-10-CM | POA: Diagnosis present

## 2013-04-20 DIAGNOSIS — R29810 Facial weakness: Secondary | ICD-10-CM | POA: Diagnosis present

## 2013-04-20 DIAGNOSIS — Z79899 Other long term (current) drug therapy: Secondary | ICD-10-CM

## 2013-04-20 HISTORY — DX: Sleepwalking (somnambulism): F51.3

## 2013-04-20 HISTORY — DX: Inflammatory liver disease, unspecified: K75.9

## 2013-04-20 HISTORY — DX: Cerebral infarction, unspecified: I63.9

## 2013-04-20 HISTORY — DX: Malignant neoplasm of prostate: C61

## 2013-04-20 HISTORY — DX: Pneumonia, unspecified organism: J18.9

## 2013-04-20 HISTORY — DX: Type 2 diabetes mellitus without complications: E11.9

## 2013-04-20 LAB — COMPREHENSIVE METABOLIC PANEL
BUN: 24 mg/dL — ABNORMAL HIGH (ref 6–23)
CO2: 24 mEq/L (ref 19–32)
Calcium: 9.6 mg/dL (ref 8.4–10.5)
Creatinine, Ser: 1.5 mg/dL — ABNORMAL HIGH (ref 0.50–1.35)
GFR calc Af Amer: 46 mL/min — ABNORMAL LOW (ref >90)
GFR calc non Af Amer: 40 mL/min — ABNORMAL LOW (ref >90)
Glucose, Bld: 273 mg/dL — ABNORMAL HIGH (ref 70–99)
Total Bilirubin: 0.4 mg/dL (ref 0.3–1.2)

## 2013-04-20 LAB — GLUCOSE, CAPILLARY: Glucose-Capillary: 99 mg/dL (ref 70–99)

## 2013-04-20 LAB — CBC WITH DIFFERENTIAL/PLATELET
Basophils Absolute: 0 10*3/uL (ref 0.0–0.1)
Eosinophils Relative: 1 % (ref 0–5)
HCT: 36.7 % — ABNORMAL LOW (ref 39.0–52.0)
Hemoglobin: 12.2 g/dL — ABNORMAL LOW (ref 13.0–17.0)
Lymphocytes Relative: 13 % (ref 12–46)
Lymphs Abs: 1.2 10*3/uL (ref 0.7–4.0)
MCV: 91.3 fL (ref 78.0–100.0)
Monocytes Absolute: 0.5 10*3/uL (ref 0.1–1.0)
Monocytes Relative: 5 % (ref 3–12)
RDW: 14.4 % (ref 11.5–15.5)
WBC: 8.7 10*3/uL (ref 4.0–10.5)

## 2013-04-20 LAB — INFLUENZA PANEL BY PCR (TYPE A & B)
Influenza A By PCR: POSITIVE — AB
Influenza B By PCR: NEGATIVE

## 2013-04-20 LAB — TROPONIN I: Troponin I: <0.3 ng/mL (ref <0.30)

## 2013-04-20 LAB — CBC
HCT: 30.5 % — ABNORMAL LOW (ref 39.0–52.0)
MCH: 31.1 pg (ref 26.0–34.0)
Platelets: 155 10*3/uL (ref 150–400)
RDW: 14.7 % (ref 11.5–15.5)
WBC: 8 10*3/uL (ref 4.0–10.5)

## 2013-04-20 LAB — CREATININE, SERUM
Creatinine, Ser: 1.4 mg/dL — ABNORMAL HIGH (ref 0.50–1.35)
GFR calc Af Amer: 50 mL/min — ABNORMAL LOW (ref >90)
GFR calc non Af Amer: 43 mL/min — ABNORMAL LOW (ref >90)

## 2013-04-20 MED ORDER — AMLODIPINE BESYLATE 5 MG PO TABS
5.0000 mg | ORAL_TABLET | Freq: Every evening | ORAL | Status: DC
  Administered 2013-04-20 – 2013-04-24 (×4): 5 mg via ORAL
  Filled 2013-04-20 (×6): qty 1

## 2013-04-20 MED ORDER — TEMAZEPAM 15 MG PO CAPS
15.0000 mg | ORAL_CAPSULE | Freq: Every day | ORAL | Status: DC
  Administered 2013-04-20 – 2013-04-24 (×5): 15 mg via ORAL
  Filled 2013-04-20 (×5): qty 1

## 2013-04-20 MED ORDER — ACETAMINOPHEN 325 MG PO TABS: 650.0000 mg | ORAL_TABLET | Freq: Four times a day (QID) | ORAL | Status: DC | PRN

## 2013-04-20 MED ORDER — MEMANTINE HCL ER 28 MG PO CP24: 28.0000 mg | ORAL_CAPSULE | Freq: Every morning | ORAL | Status: DC

## 2013-04-20 MED ORDER — ONDANSETRON HCL 4 MG/2ML IJ SOLN: 4.0000 mg | Freq: Four times a day (QID) | INTRAMUSCULAR | Status: DC | PRN

## 2013-04-20 MED ORDER — MEMANTINE HCL ER 28 MG PO CP24
28.0000 mg | ORAL_CAPSULE | Freq: Every morning | ORAL | Status: DC
  Filled 2013-04-20: qty 28

## 2013-04-20 MED ORDER — BENAZEPRIL HCL 20 MG PO TABS
20.0000 mg | ORAL_TABLET | Freq: Every day | ORAL | Status: DC
  Administered 2013-04-21: 20 mg via ORAL
  Filled 2013-04-20: qty 1

## 2013-04-20 MED ORDER — SODIUM CHLORIDE 0.9 % IV SOLN
INTRAVENOUS | Status: DC
  Administered 2013-04-20: 16:00:00 via INTRAVENOUS

## 2013-04-20 MED ORDER — SODIUM CHLORIDE 0.9 % IV SOLN
INTRAVENOUS | Status: DC
  Administered 2013-04-21 – 2013-04-22 (×2): via INTRAVENOUS

## 2013-04-20 MED ORDER — ASPIRIN 325 MG PO TABS
325.0000 mg | ORAL_TABLET | Freq: Every day | ORAL | Status: DC
  Administered 2013-04-21 – 2013-04-25 (×5): 325 mg via ORAL
  Filled 2013-04-20 (×6): qty 1

## 2013-04-20 MED ORDER — INSULIN ASPART 100 UNIT/ML ~~LOC~~ SOLN: 0.0000 [IU] | Freq: Every day | SUBCUTANEOUS | Status: DC

## 2013-04-20 MED ORDER — HEPARIN SODIUM (PORCINE) 5000 UNIT/ML IJ SOLN
5000.0000 [IU] | Freq: Three times a day (TID) | INTRAMUSCULAR | Status: DC
  Administered 2013-04-20 – 2013-04-25 (×16): 5000 [IU] via SUBCUTANEOUS
  Filled 2013-04-20 (×18): qty 1

## 2013-04-20 MED ORDER — INSULIN ASPART 100 UNIT/ML ~~LOC~~ SOLN
0.0000 [IU] | Freq: Three times a day (TID) | SUBCUTANEOUS | Status: DC
  Administered 2013-04-21 – 2013-04-22 (×3): 2 [IU] via SUBCUTANEOUS
  Administered 2013-04-22: 14:00:00 3 [IU] via SUBCUTANEOUS
  Administered 2013-04-23: 2 [IU] via SUBCUTANEOUS
  Administered 2013-04-23: 18:00:00 3 [IU] via SUBCUTANEOUS
  Administered 2013-04-24: 2 [IU] via SUBCUTANEOUS
  Administered 2013-04-24: 12:00:00 3 [IU] via SUBCUTANEOUS
  Administered 2013-04-24: 18:00:00 2 [IU] via SUBCUTANEOUS
  Administered 2013-04-25 (×2): 3 [IU] via SUBCUTANEOUS

## 2013-04-20 MED ORDER — DONEPEZIL HCL 10 MG PO TABS
10.0000 mg | ORAL_TABLET | Freq: Every day | ORAL | Status: DC
  Administered 2013-04-20 – 2013-04-24 (×5): 10 mg via ORAL
  Filled 2013-04-20 (×7): qty 1

## 2013-04-20 MED ORDER — DEXTROSE 5 % IV SOLN
1.0000 g | INTRAVENOUS | Status: DC
  Administered 2013-04-20 – 2013-04-24 (×5): 1 g via INTRAVENOUS
  Filled 2013-04-20 (×6): qty 10

## 2013-04-20 MED ORDER — OXYBUTYNIN CHLORIDE ER 10 MG PO TB24
10.0000 mg | ORAL_TABLET | Freq: Every day | ORAL | Status: DC
  Administered 2013-04-21 – 2013-04-25 (×5): 10 mg via ORAL
  Filled 2013-04-20 (×5): qty 1

## 2013-04-20 MED ORDER — LEVOFLOXACIN IN D5W 750 MG/150ML IV SOLN
750.0000 mg | Freq: Once | INTRAVENOUS | Status: AC
  Administered 2013-04-20: 750 mg via INTRAVENOUS
  Filled 2013-04-20: qty 150

## 2013-04-20 MED ORDER — FERROUS SULFATE 325 (65 FE) MG PO TABS
325.0000 mg | ORAL_TABLET | Freq: Two times a day (BID) | ORAL | Status: DC
  Administered 2013-04-20 – 2013-04-25 (×10): 325 mg via ORAL
  Filled 2013-04-20 (×11): qty 1

## 2013-04-20 MED ORDER — ONDANSETRON HCL 4 MG PO TABS: 4.0000 mg | ORAL_TABLET | Freq: Four times a day (QID) | ORAL | Status: DC | PRN

## 2013-04-20 MED ORDER — SODIUM CHLORIDE 0.9 % IV BOLUS (SEPSIS)
500.0000 mL | Freq: Once | INTRAVENOUS | Status: AC
  Administered 2013-04-20: 500 mL via INTRAVENOUS

## 2013-04-20 MED ORDER — DEXTROSE 5 % IV SOLN
500.0000 mg | INTRAVENOUS | Status: DC
  Administered 2013-04-20: 19:00:00 500 mg via INTRAVENOUS
  Filled 2013-04-20 (×2): qty 500

## 2013-04-20 MED ORDER — ONDANSETRON HCL 4 MG/2ML IJ SOLN: 4.0000 mg | Freq: Three times a day (TID) | INTRAMUSCULAR | Status: DC | PRN

## 2013-04-20 MED ORDER — SIMVASTATIN 20 MG PO TABS
20.0000 mg | ORAL_TABLET | Freq: Every day | ORAL | Status: DC
  Administered 2013-04-21 – 2013-04-24 (×4): 20 mg via ORAL
  Filled 2013-04-20 (×5): qty 1

## 2013-04-20 MED ORDER — ACETAMINOPHEN 650 MG RE SUPP: 650.0000 mg | Freq: Four times a day (QID) | RECTAL | Status: DC | PRN

## 2013-04-20 MED ORDER — MORPHINE SULFATE 2 MG/ML IJ SOLN: 2.0000 mg | INTRAMUSCULAR | Status: DC | PRN

## 2013-04-20 NOTE — ED Notes (Addendum)
Pt daughter brought pt for cough, congestion and increased weakness x 2 days. Daughter sts pt is a baseline mental status but is normally able to ambulate with a walker and today has not been able to do so. Pt given "nighttime cold meds" last night by daughter for cold symptoms. Pt incontinent with saturated brief upon arrival. Daughter sts pt normally able to change brief on his own.

## 2013-04-20 NOTE — ED Provider Notes (Addendum)
CSN: [id number]     Arrival date & time 04/20/13  1056 History   First MD Initiated Contact with Patient 04/20/13 1123     Chief Complaint  Patient presents with  . Cough  . Nasal Congestion  . Weakness   (Consider location/radiation/quality/duration/timing/severity/associated sxs/prior Treatment) HPI Comments: 79 yo male with dementia, htn, DM presents with daughter from home after worsening cough, congestion and general weakness. Pt normally uses walker but the past two days general weakness worsening. Decr po intake, no vomiting.  No fevers. Pt required assistance at home to get up which is abnormal, nothing improves sxs.   Patient is a 79 y.o. male presenting with cough and weakness. The history is provided by the patient.  Cough Associated symptoms: no chest pain, no chills, no fever, no headaches, no rash and no shortness of breath   Weakness Pertinent negatives include no chest pain, no abdominal pain, no headaches and no shortness of breath.    Past Medical History  Diagnosis Date  . Hyperlipidemia   . Dementia   . Vertigo   . Benign prostatic hypertrophy   . Diabetes mellitus without complication   . Hypertension    Past Surgical History  Procedure Laterality Date  . Prostatectomy     Family History  Problem Relation Age of Onset  . Dementia Sister    History  Substance Use Topics  . Smoking status: Never Smoker   . Smokeless tobacco: Not on file  . Alcohol Use: No    Review of Systems  Constitutional: Positive for appetite change. Negative for fever and chills.  HENT: Negative for congestion.   Eyes: Negative for visual disturbance.  Respiratory: Positive for cough. Negative for shortness of breath.   Cardiovascular: Negative for chest pain.  Gastrointestinal: Negative for vomiting and abdominal pain.  Genitourinary: Negative for dysuria and flank pain.  Musculoskeletal: Negative for back pain, neck pain and neck stiffness.  Skin: Negative for rash.   Neurological: Positive for weakness. Negative for light-headedness and headaches.  Psychiatric/Behavioral: Positive for confusion.    Allergies  Review of patient's allergies indicates no known allergies.  Home Medications   Current Outpatient Rx  Name  Route  Sig  Dispense  Refill  . temazepam (RESTORIL) 15 MG capsule   Oral   Take 15 mg by mouth at bedtime as needed for sleep.         Marland Kitchen amLODipine (NORVASC) 5 MG tablet   Oral   Take 5 mg by mouth every evening.          Marland Kitchen aspirin 325 MG tablet   Oral   Take 1 tablet (325 mg total) by mouth daily.   30 tablet   0   . benazepril (LOTENSIN) 20 MG tablet   Oral   Take 20 mg by mouth daily.         Marland Kitchen donepezil (ARICEPT) 10 MG tablet   Oral   Take 10 mg by mouth at bedtime.          . ferrous sulfate 325 (65 FE) MG tablet   Oral   Take 325 mg by mouth daily with breakfast.         . fish oil-omega-3 fatty acids 1000 MG capsule   Oral   Take 1 g by mouth 2 (two) times daily.         . memantine (NAMENDA) 10 MG tablet   Oral   Take 10 mg by mouth 2 (two) times daily.         Marland Kitchen  menthol-zinc oxide (GOLD BOND) powder   Topical   Apply 1 application topically every 7 (seven) days. As needed for foot fungus         . oxybutynin (DITROPAN-XL) 10 MG 24 hr tablet   Oral   Take 10 mg by mouth daily.         . pravastatin (PRAVACHOL) 40 MG tablet   Oral   Take 40 mg by mouth daily.         Marland Kitchen tolterodine (DETROL LA) 2 MG 24 hr capsule   Oral   Take 2 mg by mouth daily.          BP 132/58  Pulse 62  Temp(Src) 98.2 F (36.8 C) (Oral)  Resp 20  SpO2 93% Physical Exam  Nursing note and vitals reviewed. Constitutional: He appears well-developed and well-nourished.  HENT:  Head: Normocephalic and atraumatic.  Dry mm  Eyes: Conjunctivae are normal. Right eye exhibits no discharge. Left eye exhibits no discharge.  Neck: Normal range of motion. Neck supple. No tracheal deviation present.   Cardiovascular: Normal rate and regular rhythm.   No murmur heard. Pulmonary/Chest: Effort normal. He has rales (bases bilateral).  Abdominal: Soft. He exhibits no distension. There is no tenderness. There is no guarding.  Musculoskeletal: He exhibits no edema.  Neurological: He is alert. GCS eye subscore is 4. GCS verbal subscore is 5. GCS motor subscore is 6.  Alert, answers most questions Pleasant dementia Supple neck, no meningismus General weakness  Skin: Skin is warm. No rash noted.  Psychiatric: He has a normal mood and affect.    ED Course  Procedures (including critical care time) Labs Review Labs Reviewed  CBC WITH DIFFERENTIAL - Abnormal; Notable for the following:    RBC 4.02 (*)    Hemoglobin 12.2 (*)    HCT 36.7 (*)    Neutrophils Relative % 81 (*)    All other components within normal limits  COMPREHENSIVE METABOLIC PANEL - Abnormal; Notable for the following:    Sodium 133 (*)    Glucose, Bld 273 (*)    BUN 24 (*)    Creatinine, Ser 1.50 (*)    GFR calc non Af Amer 40 (*)    GFR calc Af Amer 46 (*)    All other components within normal limits  CULTURE, BLOOD (ROUTINE X 2)  CULTURE, BLOOD (ROUTINE X 2)  TROPONIN I  INFLUENZA PANEL BY PCR  URINALYSIS, ROUTINE W REFLEX MICROSCOPIC   Imaging Review Dg Chest 2 View  04/20/2013   CLINICAL DATA:  Cough and chest congestion.  EXAM: CHEST  2 VIEW  COMPARISON:  07/27/2012  FINDINGS: Central peribronchial thickening again seen bilaterally. There is mild asymmetric airspace disease in the central right upper lobe, suspicious for pneumonia. No other sites of pulmonary airspace disease or consolidation noted. No evidence of pleural effusion. Heart size is stable and within normal limits.  IMPRESSION: New airspace disease in central right upper lobe, suspicious for pneumonia. Recommend short-term radiographic followup in several weeks to confirm resolution.   Electronically Signed   By: Myles Rosenthal M.D.   On: 04/20/2013  13:21    EKG Interpretation    Date/Time:  Thursday April 20 2013 11:31:32 EST Ventricular Rate:  82 PR Interval:  194 QRS Duration: 98 QT Interval:  364 QTC Calculation: 425 R Axis:   -47 Text Interpretation:  Normal sinus rhythm Left axis deviation Abnormal ECG Confirmed by Eldonna Neuenfeldt  MD, Nicolaus Andel ([id number]) on 04/20/2013 2:28:25 PM  MDM   1. CAP (community acquired pneumonia)   2. General weakness   3. CRF (chronic renal failure)   4. Hyperglycemia    Concern for pneumonia vs possible UTI. Dehydration clinically, fluids given. CXR Labs Levaquin in ED.   The patients results and plan were reviewed and discussed.   Any x-rays performed were personally reviewed by myself.   Differential diagnosis were considered with the presenting HPI.  Diagnosis: CAP  EKG: no acute findings  Admission/ observation were discussed with the admitting physician, patient and/or family and they are comfortable with the plan.  Spoke with Dr Benjamine Mola [facility] for transfer, observation.     Enid Skeens, MD 04/20/13 1426  Enid Skeens, MD 04/20/13 [phone]

## 2013-04-20 NOTE — Progress Notes (Signed)
Patient accepted to obs med surg bed from [facility]  PNA- O2 sats 90% on RA, started on levaquin.  Generalized weakness- lives at home with family  Marlin Canary DO

## 2013-04-20 NOTE — Progress Notes (Signed)
Pt admitted to the unit at 1620. Pt mental status is alert, oriented to self and place. Pt oriented to room, staff, and call bell. Skin is intact except stage 1 ulcer on sacrum. Full assessment charted in CH[facility]. Call bell within reach. Visitor guidelines reviewed w/ pt and family.
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
## 2013-04-21 DIAGNOSIS — J189 Pneumonia, unspecified organism: Secondary | ICD-10-CM | POA: Diagnosis present

## 2013-04-21 DIAGNOSIS — N189 Chronic kidney disease, unspecified: Secondary | ICD-10-CM

## 2013-04-21 LAB — URINALYSIS, ROUTINE W REFLEX MICROSCOPIC
Bilirubin Urine: NEGATIVE
Glucose, UA: NEGATIVE mg/dL
Hgb urine dipstick: NEGATIVE
Protein, ur: NEGATIVE mg/dL
Urobilinogen, UA: 0.2 mg/dL (ref 0.0–1.0)

## 2013-04-21 LAB — CBC
HCT: 34.5 % — ABNORMAL LOW (ref 39.0–52.0)
Hemoglobin: 11.5 g/dL — ABNORMAL LOW (ref 13.0–17.0)
MCH: 30.3 pg (ref 26.0–34.0)
MCHC: 33.3 g/dL (ref 30.0–36.0)
MCV: 90.8 fL (ref 78.0–100.0)
RBC: 3.8 MIL/uL — ABNORMAL LOW (ref 4.22–5.81)
WBC: 7.6 10*3/uL (ref 4.0–10.5)

## 2013-04-21 LAB — COMPREHENSIVE METABOLIC PANEL
AST: 16 U/L (ref 0–37)
Albumin: 2.9 g/dL — ABNORMAL LOW (ref 3.5–5.2)
Calcium: 8.7 mg/dL (ref 8.4–10.5)
Creatinine, Ser: 1.53 mg/dL — ABNORMAL HIGH (ref 0.50–1.35)
GFR calc Af Amer: 45 mL/min — ABNORMAL LOW (ref >90)
GFR calc non Af Amer: 39 mL/min — ABNORMAL LOW (ref >90)
Potassium: 4 mEq/L (ref 3.5–5.1)
Total Protein: 5.9 g/dL — ABNORMAL LOW (ref 6.0–8.3)

## 2013-04-21 LAB — HEMOGLOBIN A1C
Hgb A1c MFr Bld: 7.8 % — ABNORMAL HIGH (ref <5.7)
Mean Plasma Glucose: 177 mg/dL — ABNORMAL HIGH (ref <117)

## 2013-04-21 LAB — GRAM STAIN

## 2013-04-21 LAB — LEGIONELLA ANTIGEN, URINE: Legionella Antigen, Urine: NEGATIVE

## 2013-04-21 LAB — HIV ANTIBODY (ROUTINE TESTING W REFLEX): HIV: NONREACTIVE

## 2013-04-21 LAB — GLUCOSE, CAPILLARY: Glucose-Capillary: 134 mg/dL — ABNORMAL HIGH (ref 70–99)

## 2013-04-21 LAB — STREP PNEUMONIAE URINARY ANTIGEN: Strep Pneumo Urinary Antigen: NEGATIVE

## 2013-04-21 MED ORDER — MEMANTINE HCL ER 7 MG PO CP24
28.0000 mg | ORAL_CAPSULE | Freq: Every day | ORAL | Status: DC
  Administered 2013-04-21: 11:00:00 28 mg via ORAL
  Filled 2013-04-21 (×2): qty 4

## 2013-04-21 MED ORDER — OSELTAMIVIR PHOSPHATE 30 MG PO CAPS
30.0000 mg | ORAL_CAPSULE | Freq: Two times a day (BID) | ORAL | Status: DC
  Administered 2013-04-21 – 2013-04-25 (×8): 30 mg via ORAL
  Filled 2013-04-21 (×9): qty 1

## 2013-04-21 MED ORDER — AZITHROMYCIN 500 MG PO TABS
500.0000 mg | ORAL_TABLET | Freq: Every day | ORAL | Status: DC
  Administered 2013-04-21 – 2013-04-24 (×4): 500 mg via ORAL
  Filled 2013-04-21 (×5): qty 1

## 2013-04-21 MED ORDER — OSELTAMIVIR PHOSPHATE 75 MG PO CAPS
75.0000 mg | ORAL_CAPSULE | Freq: Two times a day (BID) | ORAL | Status: DC
  Administered 2013-04-21: 11:00:00 75 mg via ORAL
  Filled 2013-04-21 (×2): qty 1

## 2013-04-21 MED ORDER — ALBUTEROL SULFATE (5 MG/ML) 0.5% IN NEBU
2.5000 mg | INHALATION_SOLUTION | Freq: Three times a day (TID) | RESPIRATORY_TRACT | Status: DC
  Administered 2013-04-21 – 2013-04-23 (×6): 2.5 mg via RESPIRATORY_TRACT
  Filled 2013-04-21 (×6): qty 0.5

## 2013-04-21 NOTE — Progress Notes (Signed)
PATIENT DETAILS Name: Casey Whitney Age: 79 y.o. Sex: male Date of Birth: Nov 09, 1921 Admit Date: 04/20/2013 Admitting Physician Joseph Art, DO ZOX:[id number], Doris Cheadle, MD  Subjective: Very pleasantly confused this morning.  Assessment/Plan: Principal Problem:   CAP (community acquired pneumonia) - Afebrile since admission, no leukocytosis. - Continue with Rocephin and Zithromax-day 2 - Could have some element of aspiration- since improving with current antibiotics, we'll continue - HIV negative, urine strep pneumonia and Legionella antigen negative.  - Blood cultures 12/18 pending   Influenza - Influenza PCR positive for influenza A.- Begin 5 day course of Tamiflu  Chronic kidney disease stage III - Creatinine very close to her usual baseline. - Monitor electrolytes periodically.  Hypertension - BP stable, continue with benazepril and amlodipine.  Diabetes - CBGs stable. - Continue with SSI, hold metformin.  Dyslipidemia - Continue with statin  Dementia with acute delirium - Pleasantly confused. - Continue with Aricept and Namenda.  Disposition: Remain inpatient-may need SNF  DVT Prophylaxis: Prophylactic Heparin  Code Status: DNR  Family Communication Daughter/Spouse at bedside  Procedures:  None  CONSULTS:  None  Time spent 40 minutes-which includes 50% of the time with face-to-face with patient and family.   MEDICATIONS: Scheduled Meds: . albuterol  2.5 mg Nebulization TID  . amLODipine  5 mg Oral QPM  . aspirin  325 mg Oral Daily  . azithromycin  500 mg Intravenous Q24H  . benazepril  20 mg Oral Daily  . cefTRIAXone (ROCEPHIN)  IV  1 g Intravenous Q24H  . donepezil  10 mg Oral QHS  . ferrous sulfate  325 mg Oral BID  . heparin  5,000 Units Subcutaneous Q8H  . insulin aspart  0-15 Units Subcutaneous TID WC  . insulin aspart  0-5 Units Subcutaneous QHS  . Memantine HCl ER  28 mg Oral Daily  . oseltamivir  75 mg Oral BID  .  oxybutynin  10 mg Oral Daily  . simvastatin  20 mg Oral q1800  . temazepam  15 mg Oral QHS   Continuous Infusions: . sodium chloride 75 mL/hr at 04/21/13 0619   PRN Meds:.acetaminophen, acetaminophen, morphine injection, ondansetron (ZOFRAN) IV, ondansetron  Antibiotics: Anti-infectives   Start     Dose/Rate Route Frequency Ordered Stop   04/21/13 1000  oseltamivir (TAMIFLU) capsule 75 mg     75 mg Oral 2 times daily 04/21/13 0743 04/26/13 0959   04/20/13 1745  cefTRIAXone (ROCEPHIN) 1 g in dextrose 5 % 50 mL IVPB     1 g 100 mL/hr over 30 Minutes Intravenous Every 24 hours 04/20/13 1736 04/27/13 1744   04/20/13 1745  azithromycin (ZITHROMAX) 500 mg in dextrose 5 % 250 mL IVPB     500 mg 250 mL/hr over 60 Minutes Intravenous Every 24 hours 04/20/13 1736 04/27/13 1744   04/20/13 1245  levofloxacin (LEVAQUIN) IVPB 750 mg     750 mg 100 mL/hr over 90 Minutes Intravenous  Once 04/20/13 1240 04/20/13 1532       PHYSICAL EXAM: Vital signs in last 24 hours: Filed Vitals:   04/20/13 1517 04/20/13 1638 04/20/13 2152 04/21/13 0537  BP: 124/59 126/79 121/74 119/69  Pulse: 80 78 72 75  Temp:  99.4 F (37.4 C) 98.4 F (36.9 C) 98.2 F (36.8 C)  TempSrc:  Oral Oral Oral  Resp: 20 20 20 20   Height:  5' (1.524 m)    Weight:  68.629 kg (151 lb 4.8 oz)    SpO2: 93% 93% 94% 91%  Weight change:  Filed Weights   04/20/13 1638  Weight: 68.629 kg (151 lb 4.8 oz)   Body mass index is 29.55 kg/(m^2).   Gen Exam:Pleasantly confused, with clear speech.   Neck: Supple, No JVD.   Chest: Good air entry B/L-rales and rhonchi all over CVS: S1 S2 Regular, no murmurs.  Abdomen: soft, BS +, non tender, non distended.  Extremities: no edema, lower extremities warm to touch. Neurologic: Non Focal.  Skin: No Rash.  Wounds: N/A.    Intake/Output from previous day:  Intake/Output Summary (Last 24 hours) at 04/21/13 1149 Last data filed at 04/21/13 0617  Gross per 24 hour  Intake    940 ml   Output   1200 ml  Net   -260 ml     LAB RESULTS: CBC  Recent Labs Lab 04/20/13 1220 04/20/13 1856 04/21/13 0630  WBC 8.7 8.0 7.6  HGB 12.2* 10.6* 11.5*  HCT 36.7* 30.5* 34.5*  PLT 169 155 163  MCV 91.3 89.4 90.8  MCH 30.3 31.1 30.3  MCHC 33.2 34.8 33.3  RDW 14.4 14.7 14.7  LYMPHSABS 1.2  --   --   MONOABS 0.5  --   --   EOSABS 0.0  --   --   BASOSABS 0.0  --   --     Chemistries   Recent Labs Lab 04/20/13 1220 04/20/13 1856 04/21/13 0630  NA 133*  --  134*  K 4.4  --  4.0  CL 96  --  101  CO2 24  --  23  GLUCOSE 273*  --  108*  BUN 24*  --  19  CREATININE 1.50* 1.40* 1.53*  CALCIUM 9.6  --  8.7    CBG:  Recent Labs Lab 04/20/13 2158 04/21/13 0758  GLUCAP 99 111*    GFR Estimated Creatinine Clearance: 26.6 ml/min (by C-G formula based on Cr of 1.53).  Coagulation profile No results found for this basename: INR, PROTIME,  in the last 168 hours  Cardiac Enzymes  Recent Labs Lab 04/20/13 1220  TROPONINI <0.30    No components found with this basename: POCBNP,  No results found for this basename: DDIMER,  in the last 72 hours No results found for this basename: HGBA1C,  in the last 72 hours No results found for this basename: CHOL, HDL, LDLCALC, TRIG, CHOLHDL, LDLDIRECT,  in the last 72 hours No results found for this basename: TSH, T4TOTAL, FREET3, T3FREE, THYROIDAB,  in the last 72 hours No results found for this basename: VITAMINB12, FOLATE, FERRITIN, TIBC, IRON, RETICCTPCT,  in the last 72 hours No results found for this basename: LIPASE, AMYLASE,  in the last 72 hours  Urine Studies No results found for this basename: UACOL, UAPR, USPG, UPH, UTP, UGL, UKET, UBIL, UHGB, UNIT, UROB, ULEU, UEPI, UWBC, URBC, UBAC, CAST, CRYS, UCOM, BILUA,  in the last 72 hours  MICROBIOLOGY: Recent Results (from the past 240 hour(s))  CULTURE, BLOOD (ROUTINE X 2)     Status: None   Collection Time    04/20/13  1:40 PM      Result Value Range Status    Specimen Description BLOOD LEFT HAND   Final   Special Requests BOTTLES DRAWN AEROBIC AND ANAEROBIC 5CC BOTH   Final   Culture  Setup Time     Final   Value: 04/20/2013 20:44     Performed at [facility]   Culture     Final   Value:  BLOOD CULTURE RECEIVED NO GROWTH TO DATE CULTURE WILL BE HELD FOR 5 DAYS BEFORE ISSUING A FINAL NEGATIVE REPORT     Performed at [facility]   Report Status PENDING   Incomplete  CULTURE, BLOOD (ROUTINE X 2)     Status: None   Collection Time    04/20/13  1:55 PM      Result Value Range Status   Specimen Description BLOOD RESISTANT WRIST   Final   Special Requests BOTTLES DRAWN AEROBIC AND ANAEROBIC 5CC BOTH   Final   Culture  Setup Time     Final   Value: 04/20/2013 20:44     Performed at [facility]   Culture     Final   Value:        BLOOD CULTURE RECEIVED NO GROWTH TO DATE CULTURE WILL BE HELD FOR 5 DAYS BEFORE ISSUING A FINAL NEGATIVE REPORT     Performed at [facility]   Report Status PENDING   Incomplete  GRAM STAIN     Status: None   Collection Time    04/21/13  4:04 AM      Result Value Range Status   Specimen Description URINE, RANDOM   Final   Special Requests NONE   Final   Gram Stain     Final   Value: CYTOSPIN SLIDE     WBC PRESENT,BOTH PMN AND MONONUCLEAR     GRAM POSITIVE RODS   Report Status 04/21/2013 FINAL   Final    RADIOLOGY STUDIES/RESULTS: Dg Chest 2 View  04/20/2013   CLINICAL DATA:  Cough and chest congestion.  EXAM: CHEST  2 VIEW  COMPARISON:  07/27/2012  FINDINGS: Central peribronchial thickening again seen bilaterally. There is mild asymmetric airspace disease in the central right upper lobe, suspicious for pneumonia. No other sites of pulmonary airspace disease or consolidation noted. No evidence of pleural effusion. Heart size is stable and within normal limits.  IMPRESSION: New airspace disease in central right upper lobe, suspicious for pneumonia. Recommend short-term  radiographic followup in several weeks to confirm resolution.   Electronically Signed   By: Myles Rosenthal M.D.   On: 04/20/2013 13:21    Jeoffrey Massed, MD  [facility] Pager:[phone]  If 7PM-7AM, please contact night-coverage [email or web address] Password [email or web address] 04/21/2013, 11:49 AM   LOS: 1 day

## 2013-04-21 NOTE — Evaluation (Signed)
Read, reviewed, and agree.  Timo Hartwig B. Ashari Llewellyn, PT, DPT #[phone]  

## 2013-04-21 NOTE — Progress Notes (Signed)
Pt's b/p is 93/53- contacted MD Ghimire. Awaiting orders for holding b/p medication and possible bolus. Will continue to monitor

## 2013-04-21 NOTE — Progress Notes (Signed)
RN assessed patient's need for oxygen. Patient stated that he was breathing just fine and was having no issues. RN checked pulse ox- 93% on RA. Will continue to monitor

## 2013-04-21 NOTE — Evaluation (Signed)
Occupational Therapy Evaluation Patient Details Name: Casey Whitney MRN: [id number] DOB: Apr 07, 1922 Today's Date: 04/21/2013 Time: 4098-1191 OT Time Calculation (min): 23 min  OT Assessment / Plan / Recommendation History of present illness With a hx of dementia, hld, htn, prior cva, and dm who presented to [facility] with cough, sob, and marked weakness that started 3-4 days prior to admit. He was then brought to the ED where a cxr demonstrated a new R sided PNA. No leukocytosis    Clinical Impression   This 79 yo male admitted with above presents to acute OT this PM doing a lot better than the did earlier with PT. Min A overall this PM. If he continues at this level, then HHOT would be appropriate, if he waxes/wanes or does worse, then OT at SNF would be more appropriate.    OT Assessment  Patient needs continued OT Services    Follow Up Recommendations  Home health OT             Frequency  Min 2X/week    Precautions / Restrictions Precautions Precautions: Fall Restrictions Weight Bearing Restrictions: No       ADL  Eating/Feeding: Set up;Supervision/safety Where Assessed - Eating/Feeding: Edge of bed Grooming: Set up;Supervision/safety Where Assessed - Grooming: Unsupported sitting Upper Body Bathing: Set up;Supervision/safety Where Assessed - Upper Body Bathing: Unsupported sitting Lower Body Bathing: Minimal assistance Where Assessed - Lower Body Bathing: Supported sit to stand Upper Body Dressing: Set up;Supervision/safety Where Assessed - Upper Body Dressing: Unsupported sitting Lower Body Dressing: Set up;Supervision/safety Where Assessed - Lower Body Dressing: Supported sit to stand Toilet Transfer: Minimal assistance Toilet Transfer Method: Sit to Barista:  (BEd>door>recliner) Toileting - Architect and Hygiene: Moderate assistance Where Assessed - Toileting Clothing Manipulation and Hygiene: Sit to stand from 3-in-1 or  toilet Equipment Used: Rolling walker;Gait belt Transfers/Ambulation Related to ADLs: Min A for all with RW ADL Comments: Pt bent forward while seated in recliner to doff and donn socks    OT Diagnosis: Cognitive deficits;Generalized weakness  OT Problem List: Decreased strength;Impaired balance (sitting and/or standing);Decreased knowledge of use of DME or AE;Decreased safety awareness;Decreased cognition OT Treatment Interventions: Self-care/ADL training;Therapeutic activities;DME and/or AE instruction;Patient/family education   OT Goals(Current goals can be found in the care plan section) Acute Rehab OT Goals Patient Stated Goal: get better OT Goal Formulation: With patient Time For Goal Achievement: 05/05/13 Potential to Achieve Goals: Good ADL Goals Pt Will Perform Grooming: with set-up;with supervision;standing (2 tasks at sink) Pt Will Perform Lower Body Bathing: with set-up;with supervision;sit to/from stand (at sink) Pt Will Perform Lower Body Dressing: with set-up;with supervision;sit to/from stand Pt Will Transfer to Toilet: with supervision;ambulating;bedside commode (over toliet) Pt Will Perform Toileting - Clothing Manipulation and hygiene: with supervision;sit to/from stand  Visit Information  Last OT Received On: 04/21/13 Assistance Needed: +1 History of Present Illness: With a hx of dementia, hld, htn, prior cva, and dm who presented to [facility] with cough, sob, and marked weakness that started 3-4 days prior to admit. He was then brought to the ED where a cxr demonstrated a new R sided PNA. No leukocytosis        Prior Functioning     Home Living Family/patient expects to be discharged to:: Private residence Living Arrangements: Spouse/significant other Available Help at Discharge: Family;Available PRN/intermittently Type of Home: House Home Access: Stairs to enter [employer] of Steps: 2 Entrance Stairs-Rails: Left Home Layout: One level Home  Equipment: Walker - 2 wheels;Shower  seat;Bedside commode Additional Comments: Has 2 daughters that intermittently come over to help throughout the day.  There is a couple hour gap throughout the day without a daughter there to help Prior Function Level of Independence: Independent Communication Communication: HOH Dominant Hand: Right         Vision/Perception Vision - History Baseline Vision: Bifocals Patient Visual Report: No change from baseline   Cognition  Cognition Arousal/Alertness: Awake/alert Behavior During Therapy: WFL for tasks assessed/performed Overall Cognitive Status: History of cognitive impairments - at baseline    Extremity/Trunk Assessment Upper Extremity Assessment Upper Extremity Assessment: Overall WFL for tasks assessed    Mobility Bed Mobility Bed Mobility: Supine to Sit;Sitting - Scoot to Edge of Bed Supine to Sit: 5: Supervision;HOB elevated;With rails Sitting - Scoot to Edge of Bed: 5: Supervision;With rail Transfers Transfers: Stand to Sit;Sit to Stand Sit to Stand: 4: Min assist;With upper extremity assist;From bed Stand to Sit: 4: Min assist;With upper extremity assist;With armrests;To chair/3-in-1 Details for Transfer Assistance: VCs for safe hand placement           End of Session OT - End of Session Equipment Utilized During Treatment: Gait belt;Rolling walker Activity Tolerance: Patient tolerated treatment well Patient left: in chair;with call bell/phone within reach;with chair alarm set Nurse Communication: Mobility status       [place], [place] [phone] 04/21/2013, 3:03 PM

## 2013-04-21 NOTE — Progress Notes (Signed)
Occupational Therapy Treatment Patient Details Name: Casey Whitney MRN: [id number] DOB: 06-13-1921 Today's Date: 04/21/2013 Time: 1452-1500 OT Time Calculation (min): 8 min  OT Assessment / Plan / Recommendation  History of present illness With a hx of dementia, hld, htn, prior cva, and dm who presented to [facility] with cough, sob, and marked weakness that started 3-4 days prior to admit. He was then brought to the ED where a cxr demonstrated a new R sided PNA. No leukocytosis    OT comments  Had gotten pt up to recliner and made nurse aware. Nurse was concerned to have him up in the chair without family present with his history of dementia and he fell out of a chair at home (even with bed alarm and pt in camera room) so I worked with pt to get back to bed.  Follow Up Recommendations  Home health OT       Equipment Recommendations  None recommended by OT       Frequency Min 2X/week   Progress towards OT Goals Progress towards OT goals: Progressing toward goals (for sit to stand part of goals)  Plan Discharge plan remains appropriate    Precautions / Restrictions Precautions Precautions: Fall Restrictions Weight Bearing Restrictions: No          OT Diagnosis: Cognitive deficits;Generalized weakness  OT Problem List: Decreased strength;Impaired balance (sitting and/or standing);Decreased knowledge of use of DME or AE;Decreased safety awareness;Decreased cognition OT Treatment Interventions: Self-care/ADL training;Therapeutic activities;DME and/or AE instruction;Patient/family education   OT Goals(current goals can now be found in the care plan section) Acute Rehab OT Goals Patient Stated Goal: get better OT Goal Formulation: With patient Time For Goal Achievement: 05/05/13 Potential to Achieve Goals: Good ADL Goals Pt Will Perform Grooming: with set-up;with supervision;standing (2 tasks at sink) Pt Will Perform Lower Body Bathing: with set-up;with supervision;sit to/from  stand (at sink) Pt Will Perform Lower Body Dressing: with set-up;with supervision;sit to/from stand Pt Will Transfer to Toilet: with supervision;ambulating;bedside commode (over toliet) Pt Will Perform Toileting - Clothing Manipulation and hygiene: with supervision;sit to/from stand  Visit Information  Last OT Received On: 04/21/13 Assistance Needed: +1 History of Present Illness: With a hx of dementia, hld, htn, prior cva, and dm who presented to [facility] with cough, sob, and marked weakness that started 3-4 days prior to admit. He was then brought to the ED where a cxr demonstrated a new R sided PNA. No leukocytosis        Prior Functioning  Home Living Family/patient expects to be discharged to:: Private residence Living Arrangements: Spouse/significant other Available Help at Discharge: Family;Available PRN/intermittently Type of Home: House Home Access: Stairs to enter [employer] of Steps: 2 Entrance Stairs-Rails: Left Home Layout: One level Home Equipment: Walker - 2 wheels;Shower seat;Bedside commode Additional Comments: Has 2 daughters that intermittently come over to help throughout the day.  There is a couple hour gap throughout the day without a daughter there to help Prior Function Level of Independence: Independent Communication Communication: HOH Dominant Hand: Right    Cognition  Cognition Arousal/Alertness: Awake/alert Behavior During Therapy: WFL for tasks assessed/performed Overall Cognitive Status: History of cognitive impairments - at baseline    Mobility  Bed Mobility Bed Mobility: Sit to Supine;Scooting to HOB Sit to Supine: 5: Supervision;HOB flat Scooting to HOB: 5: Supervision;With rail (VCs where to put his hands and to push with his feet) Details for Bed Mobility Assistance: Is able to pull on rails to A with log roll, requires A  to help pull on UE to achieve sitting.  Is able to push with his arms to support himself and initiate the bed  mobility          End of Session OT - End of Session Equipment Utilized During Treatment: Gait belt;Rolling walker Activity Tolerance: Patient tolerated treatment well Patient left: in bed;with call bell/phone within reach;with bed alarm set Nurse Communication: Pt back in bed and O2 is not on him (she reported that they would spot check him since he is not on O2 at home)       Swade, Shonka [phone] 04/21/2013, 3:11 PM

## 2013-04-21 NOTE — Evaluation (Signed)
Physical Therapy Evaluation Patient Details Name: Casey Whitney MRN: [id number] DOB: 25-Jan-1922 Today's Date: 04/21/2013 Time: 1133-1203 PT Time Calculation (min): 30 min  PT Assessment / Plan / Recommendation History of Present Illness  With a hx of dementia, hld, htn, prior cva, and dm who presented to [facility] with cough, sob, and marked weakness that started 3-4 days prior to admit. He was then brought to the ED where a cxr demonstrated a new R sided PNA. No leukocytosis   Clinical Impression  Pt admitted with above.  Patient's cognition is decreased, but per family report is at baseline.  Patient's physical function is significantly decreased from his pre-admission level.  He requires total assist for standing and mobility.  Pt will not be safe to return home with this low level of function at this time.  He will need to be continually re-assessed to be able to provide him the best opportunity of care once d/c from this venue.  Pt's family is aware of d/c options, and is hoping for rapid improvement and return to home with services. Pt will benefit from skilled PT to increase their independence and safety with mobility to allow discharge to the venue listed below.      PT Assessment  Patient needs continued PT services    Follow Up Recommendations  SNF (Pending improvements in function)       Barriers to Discharge Decreased caregiver support      Equipment Recommendations  None recommended by PT (Pt has a RW already)       Frequency Min 3X/week    Precautions / Restrictions Precautions Precautions: None Restrictions Weight Bearing Restrictions: No  Foley catheter  Pertinent Vitals/Pain None reported. On 2 L O2 via [place] throughout session.        Mobility  Bed Mobility Bed Mobility: Supine to Sit Supine to Sit: 3: Mod assist Details for Bed Mobility Assistance: Is able to pull on rails to A with log roll, requires A to help pull on UE to achieve sitting.  Is able to push  with his arms to support himself and initiate the bed mobility Transfers Transfers: Stand Pivot Transfers Stand Pivot Transfers: 1: +1 Total assist Details for Transfer Assistance: VC for proper hand placement.  Assistance on B ischeal tuberosities.  Patient performed 10% of work.  Block his knees and support to prevent knee buckling.  Patient has poor hand placement once A and pulls on railings without letting go.  He is easier to stand once he lets go of railings and puts his arms on the PT shoulder   Ambulation/Gait Ambulation/Gait Assistance: Not tested (comment)        PT Diagnosis: Difficulty walking;Generalized weakness  PT Problem List: Decreased strength;Decreased balance;Decreased mobility;Decreased coordination PT Treatment Interventions: DME instruction;Gait training;Functional mobility training;Therapeutic activities;Neuromuscular re-education;Patient/family education     PT Goals(Current goals can be found in the care plan section) Acute Rehab PT Goals Patient Stated Goal: get better PT Goal Formulation: With patient/family Time For Goal Achievement: 05/05/13 Potential to Achieve Goals: Good  Visit Information  Last PT Received On: 04/21/13 Assistance Needed: +2 History of Present Illness: With a hx of dementia, hld, htn, prior cva, and dm who presented to [facility] with cough, sob, and marked weakness that started 3-4 days prior to admit. He was then brought to the ED where a cxr demonstrated a new R sided PNA. No leukocytosis        Prior Functioning  Home Living Family/patient expects to be discharged  to:: Private residence Living Arrangements: Spouse/significant other;Children Available Help at Discharge: Family;Available 24 hours/day Type of Home: House Home Access: Stairs to enter [employer] of Steps: 2 Entrance Stairs-Rails: Left Home Layout: One level Home Equipment: Walker - 2 wheels Additional Comments: Has 2 daughters that intermittently come  over to help throughout the day.  There is a couple hour gap throughout the day without a daughter there to help Prior Function Level of Independence: Independent Communication Communication: HOH (Unclear speech)    Cognition  Cognition Arousal/Alertness: Lethargic Behavior During Therapy: Flat affect Overall Cognitive Status: History of cognitive impairments - at baseline    Extremity/Trunk Assessment Upper Extremity Assessment Upper Extremity Assessment: Defer to OT evaluation Lower Extremity Assessment Lower Extremity Assessment: Generalized weakness   Balance Balance Balance Assessed: Yes Static Sitting Balance Static Sitting - Balance Support: Bilateral upper extremity supported Static Sitting - Level of Assistance: 4: Min assist (Once he is assisted to sitting, he is unable to maintain upright balance.  He falls backwards unless there is someone behind him to provide A.  He is aware he is falling and unable to sit up, but can't fix the problem yet) Static Sitting - Comment/# of Minutes: 5  End of Session PT - End of Session Equipment Utilized During Treatment: Gait belt Activity Tolerance: Patient limited by fatigue Patient left: in chair;with call bell/phone within reach;with family/visitor present Nurse Communication: Mobility status  GP    Barrie Dunker, SPT Pager:  [phone]  Barrie Dunker 04/21/2013, 2:30 PM
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
## 2013-04-21 NOTE — Progress Notes (Signed)
Just spoke with OT.  During her session, patient was able to ambulate to the door with only min A.  If patient keeps this progession, he will be more appropriate for HHPT services than SNF placement. 

## 2013-04-21 NOTE — Progress Notes (Signed)
Read, reviewed, and agree.  Amybeth Sieg B. Ellard Nan, PT, DPT #[id number]  

## 2013-04-22 LAB — BASIC METABOLIC PANEL
BUN: 19 mg/dL (ref 6–23)
Calcium: 8.1 mg/dL — ABNORMAL LOW (ref 8.4–10.5)
Chloride: 104 mEq/L (ref 96–112)
Creatinine, Ser: 1.53 mg/dL — ABNORMAL HIGH (ref 0.50–1.35)
GFR calc non Af Amer: 39 mL/min — ABNORMAL LOW (ref >90)
Glucose, Bld: 102 mg/dL — ABNORMAL HIGH (ref 70–99)

## 2013-04-22 LAB — GLUCOSE, CAPILLARY
Glucose-Capillary: 103 mg/dL — ABNORMAL HIGH (ref 70–99)
Glucose-Capillary: 161 mg/dL — ABNORMAL HIGH (ref 70–99)
Glucose-Capillary: 138 mg/dL — ABNORMAL HIGH (ref 70–99)

## 2013-04-22 MED ORDER — MEMANTINE HCL ER 28 MG PO CP24
28.0000 mg | ORAL_CAPSULE | Freq: Every day | ORAL | Status: DC
  Administered 2013-04-22 – 2013-04-25 (×4): 28 mg via ORAL
  Filled 2013-04-22 (×5): qty 28

## 2013-04-22 NOTE — Progress Notes (Signed)
PATIENT DETAILS Name: Casey Whitney Age: 79 y.o. Sex: male Date of Birth: 1922-04-12 Admit Date: 04/20/2013 Admitting Physician Joseph Art, DO [id number], Doris Cheadle, MD  Subjective: Much more alert this am-compared to yesterday  Assessment/Plan: Principal Problem:   CAP (community acquired pneumonia) - Afebrile since admission, no leukocytosis. - Continue with Rocephin and Zithromax-day 3 - Could have some element of aspiration- since improving with current antibiotics, we'll continue - HIV negative, urine strep pneumonia and Legionella antigen negative.  - Blood cultures 12/18 no growth  Influenza - Influenza PCR positive for influenza A.- Begin 5 day course of Tamiflu  Chronic kidney disease stage III - Creatinine very close to her usual baseline. - Monitor electrolytes periodically.  Hypertension - BP stable, continue with amlodipine. -benazepril on hold as BP Soft  Diabetes - CBGs stable. - Continue with SSI, hold metformin.  Dyslipidemia - Continue with statin  Dementia with acute delirium - Pleasantly confused. - Continue with Aricept and Namenda.  Disposition: Remain inpatient-may need SNF vs HHPT  DVT Prophylaxis: Prophylactic Heparin  Code Status: DNR  Family Communication Daughter/Spouse at bedside  Procedures:  None  CONSULTS:  None   MEDICATIONS: Scheduled Meds: . albuterol  2.5 mg Nebulization TID  . amLODipine  5 mg Oral QPM  . aspirin  325 mg Oral Daily  . azithromycin  500 mg Oral q1800  . cefTRIAXone (ROCEPHIN)  IV  1 g Intravenous Q24H  . donepezil  10 mg Oral QHS  . ferrous sulfate  325 mg Oral BID  . heparin  5,000 Units Subcutaneous Q8H  . insulin aspart  0-15 Units Subcutaneous TID WC  . insulin aspart  0-5 Units Subcutaneous QHS  . Memantine HCl ER  28 mg Oral Daily  . oseltamivir  30 mg Oral BID  . oxybutynin  10 mg Oral Daily  . simvastatin  20 mg Oral q1800  . temazepam  15 mg Oral QHS   Continuous  Infusions:   PRN Meds:.acetaminophen, acetaminophen, morphine injection, ondansetron (ZOFRAN) IV, ondansetron  Antibiotics: Anti-infectives   Start     Dose/Rate Route Frequency Ordered Stop   04/21/13 2200  oseltamivir (TAMIFLU) capsule 30 mg     30 mg Oral 2 times daily 04/21/13 1620 04/26/13 0959   04/21/13 1800  azithromycin (ZITHROMAX) tablet 500 mg     500 mg Oral Daily-1800 04/21/13 1340 04/27/13 1759   04/21/13 1000  oseltamivir (TAMIFLU) capsule 75 mg  Status:  Discontinued     75 mg Oral 2 times daily 04/21/13 0743 04/21/13 1620   04/20/13 1745  cefTRIAXone (ROCEPHIN) 1 g in dextrose 5 % 50 mL IVPB     1 g 100 mL/hr over 30 Minutes Intravenous Every 24 hours 04/20/13 1736 04/27/13 1744   04/20/13 1745  azithromycin (ZITHROMAX) 500 mg in dextrose 5 % 250 mL IVPB  Status:  Discontinued     500 mg 250 mL/hr over 60 Minutes Intravenous Every 24 hours 04/20/13 1736 04/21/13 1339   04/20/13 1245  levofloxacin (LEVAQUIN) IVPB 750 mg     750 mg 100 mL/hr over 90 Minutes Intravenous  Once 04/20/13 1240 04/20/13 1532       PHYSICAL EXAM: Vital signs in last 24 hours: Filed Vitals:   04/21/13 2052 04/22/13 0643 04/22/13 1436 04/22/13 1437  BP: 110/61 136/62  117/64  Pulse: 68 70 73 79  Temp: 98.1 F (36.7 C) 97.8 F (36.6 C)  98.5 F (36.9 C)  TempSrc: Oral Oral  Oral  Resp: 18 18  18   Height:      Weight:      SpO2: 92% 92% 96% 91%    Weight change:  Filed Weights   04/20/13 1638  Weight: 68.629 kg (151 lb 4.8 oz)   Body mass index is 29.55 kg/(m^2).   Gen Exam:Pleasantly confused, with clear speech.   Neck: Supple, No JVD.   Chest: Good air entry Bibasilar rales CVS: S1 S2 Regular, no murmurs.  Abdomen: soft, BS +, non tender, non distended.  Extremities: no edema, lower extremities warm to touch. Neurologic: Non Focal.  Skin: No Rash.  Wounds: N/A.    Intake/Output from previous day:  Intake/Output Summary (Last 24 hours) at 04/22/13 1524 Last data  filed at 04/22/13 0931  Gross per 24 hour  Intake 2118.75 ml  Output    125 ml  Net 1993.75 ml     LAB RESULTS: CBC  Recent Labs Lab 04/20/13 1220 04/20/13 1856 04/21/13 0630  WBC 8.7 8.0 7.6  HGB 12.2* 10.6* 11.5*  HCT 36.7* 30.5* 34.5*  PLT 169 155 163  MCV 91.3 89.4 90.8  MCH 30.3 31.1 30.3  MCHC 33.2 34.8 33.3  RDW 14.4 14.7 14.7  LYMPHSABS 1.2  --   --   MONOABS 0.5  --   --   EOSABS 0.0  --   --   BASOSABS 0.0  --   --     Chemistries   Recent Labs Lab 04/20/13 1220 04/20/13 1856 04/21/13 0630 04/22/13 0600  NA 133*  --  134* 137  K 4.4  --  4.0 3.8  CL 96  --  101 104  CO2 24  --  23 22  GLUCOSE 273*  --  108* 102*  BUN 24*  --  19 19  CREATININE 1.50* 1.40* 1.53* 1.53*  CALCIUM 9.6  --  8.7 8.1*    CBG:  Recent Labs Lab 04/21/13 1150 04/21/13 1639 04/21/13 2140 04/22/13 0756 04/22/13 1231  GLUCAP 134* 114* 88 103* 161*    GFR Estimated Creatinine Clearance: 26.6 ml/min (by C-G formula based on Cr of 1.53).  Coagulation profile No results found for this basename: INR, PROTIME,  in the last 168 hours  Cardiac Enzymes  Recent Labs Lab 04/20/13 1220  TROPONINI <0.30    No components found with this basename: POCBNP,  No results found for this basename: DDIMER,  in the last 72 hours  Recent Labs  04/21/13 0630  HGBA1C 7.8*   No results found for this basename: CHOL, HDL, LDLCALC, TRIG, CHOLHDL, LDLDIRECT,  in the last 72 hours No results found for this basename: TSH, T4TOTAL, FREET3, T3FREE, THYROIDAB,  in the last 72 hours No results found for this basename: VITAMINB12, FOLATE, FERRITIN, TIBC, IRON, RETICCTPCT,  in the last 72 hours No results found for this basename: LIPASE, AMYLASE,  in the last 72 hours  Urine Studies No results found for this basename: UACOL, UAPR, USPG, UPH, UTP, UGL, UKET, UBIL, UHGB, UNIT, UROB, ULEU, UEPI, UWBC, URBC, UBAC, CAST, CRYS, UCOM, BILUA,  in the last 72 hours  MICROBIOLOGY: Recent  Results (from the past 240 hour(s))  CULTURE, BLOOD (ROUTINE X 2)     Status: None   Collection Time    04/20/13  1:40 PM      Result Value Range Status   Specimen Description BLOOD LEFT HAND   Final   Special Requests BOTTLES DRAWN AEROBIC AND ANAEROBIC 5CC BOTH   Final   Culture  Setup Time     Final   Value: 04/20/2013 20:44     Performed at [facility]   Culture     Final   Value:        BLOOD CULTURE RECEIVED NO GROWTH TO DATE CULTURE WILL BE HELD FOR 5 DAYS BEFORE ISSUING A FINAL NEGATIVE REPORT     Performed at [facility]   Report Status PENDING   Incomplete  CULTURE, BLOOD (ROUTINE X 2)     Status: None   Collection Time    04/20/13  1:55 PM      Result Value Range Status   Specimen Description BLOOD RESISTANT WRIST   Final   Special Requests BOTTLES DRAWN AEROBIC AND ANAEROBIC 5CC BOTH   Final   Culture  Setup Time     Final   Value: 04/20/2013 20:44     Performed at [facility]   Culture     Final   Value:        BLOOD CULTURE RECEIVED NO GROWTH TO DATE CULTURE WILL BE HELD FOR 5 DAYS BEFORE ISSUING A FINAL NEGATIVE REPORT     Performed at [facility]   Report Status PENDING   Incomplete  CULTURE, BLOOD (ROUTINE X 2)     Status: None   Collection Time    04/20/13  6:56 PM      Result Value Range Status   Specimen Description BLOOD ARM LEFT   Final   Special Requests BOTTLES DRAWN AEROBIC ONLY 3CC   Final   Culture  Setup Time     Final   Value: 04/21/2013 02:08     Performed at [facility]   Culture     Final   Value:        BLOOD CULTURE RECEIVED NO GROWTH TO DATE CULTURE WILL BE HELD FOR 5 DAYS BEFORE ISSUING A FINAL NEGATIVE REPORT     Performed at [facility]   Report Status PENDING   Incomplete  CULTURE, BLOOD (ROUTINE X 2)     Status: None   Collection Time    04/20/13  7:03 PM      Result Value Range Status   Specimen Description BLOOD ARM LEFT   Final   Special Requests BOTTLES DRAWN AEROBIC ONLY 5CC    Final   Culture  Setup Time     Final   Value: 04/21/2013 02:08     Performed at [facility]   Culture     Final   Value:        BLOOD CULTURE RECEIVED NO GROWTH TO DATE CULTURE WILL BE HELD FOR 5 DAYS BEFORE ISSUING A FINAL NEGATIVE REPORT     Performed at [facility]   Report Status PENDING   Incomplete  GRAM STAIN     Status: None   Collection Time    04/21/13  4:04 AM      Result Value Range Status   Specimen Description URINE, RANDOM   Final   Special Requests NONE   Final   Gram Stain     Final   Value: CYTOSPIN SLIDE     WBC PRESENT,BOTH PMN AND MONONUCLEAR     GRAM POSITIVE RODS   Report Status 04/21/2013 FINAL   Final    RADIOLOGY STUDIES/RESULTS: Dg Chest 2 View  04/20/2013   CLINICAL DATA:  Cough and chest congestion.  EXAM: CHEST  2 VIEW  COMPARISON:  07/27/2012  FINDINGS: Central peribronchial  thickening again seen bilaterally. There is mild asymmetric airspace disease in the central right upper lobe, suspicious for pneumonia. No other sites of pulmonary airspace disease or consolidation noted. No evidence of pleural effusion. Heart size is stable and within normal limits.  IMPRESSION: New airspace disease in central right upper lobe, suspicious for pneumonia. Recommend short-term radiographic followup in several weeks to confirm resolution.   Electronically Signed   By: Myles Rosenthal M.D.   On: 04/20/2013 13:21    Jeoffrey Massed, MD  [facility] Pager:[phone]  If 7PM-7AM, please contact night-coverage [email or web address] Password [email or web address] 04/22/2013, 3:24 PM   LOS: 2 days

## 2013-04-22 NOTE — Progress Notes (Signed)
Speech Language Pathology Treatment: Dysphagia  Patient Details Name: Casey Whitney MRN: [id number] DOB: 1922/02/04 Today's Date: 04/22/2013 Time: 4098-1191 SLP Time Calculation (min): 27 min  Assessment / Plan / Recommendation Clinical Impression  Per RN, pt tolerating current diet well; no cough response to thin liquids via cup sip.  SLP observed pt drinking water via cup. No overt s/s aspiration.  Straws found in pt room - these were thrown away, and additional signage written on board for NO STRAWS per evaluating SLP.  ST to continue to monitor diet tolerance.   HPI HPI: 79 yo male adm to [facility] with weakness x3-4 days, dementia, CVA, + cough, nocturnal wandering.  Pt tested + for influenza and CXR showed right upper lobe airspace dx.  Pt is afebrile, 2 liters and lungs are decreased/rhonchorous.    BSE, ordered to aid in determining diet recommendation, was completed 12/19.  Rec dys 3, ground meat, thin liquids no straws.     Pertinent Vitals VSS  SLP Plan  Continue with current plan of care    Recommendations  per POC              Oral Care Recommendations: Oral care BID Follow up Recommendations: 24 hour supervision/assistance Plan: Continue with current plan of care    GO    Celia B. Murvin Natal [facility], CCC-SLP [phone]  Leigh Aurora 04/22/2013, 10:52 AM

## 2013-04-22 NOTE — Progress Notes (Signed)
Physical Therapy Treatment Patient Details Name: Nickholas Goldston MRN: [id number] DOB: [birth year] Today's Date: 04/22/2013 Time: 7829-5621 PT Time Calculation (min): 39 min  PT Assessment / Plan / Recommendation  History of Present Illness With a hx of dementia, hld, htn, prior cva, and dm who presented to [facility] with cough, sob, and marked weakness that started 3-4 days prior to admit. He was then brought to the ED where a cxr demonstrated a new R sided PNA. No leukocytosis . Pt with flu   PT Comments   Informed by RN pt progressing and family desires HHPT. Pt greatly improved with mobility today and discharge home is appropriate with family support. Pt with loss of condom cath on arrival with assist for pericare. With briefs ambulated to door but pt incontinent of bowel and bladder and additional toileting and pericare prior to hall ambulation. Dgtr present throughout session and pt educated for safety with transfers, gait and HEP. Will continue to follow.   Follow Up Recommendations  Home health PT;Supervision/Assistance - 24 hour     Does the patient have the potential to tolerate intense rehabilitation     Barriers to Discharge        Equipment Recommendations  None recommended by PT    Recommendations for Other Services    Frequency     Progress towards PT Goals Progress towards PT goals: Goals met and updated - see care plan  Plan Discharge plan needs to be updated    Precautions / Restrictions Precautions Precautions: Fall   Pertinent Vitals/Pain 96% RA 73 No pain    Mobility  Bed Mobility Bed Mobility: Rolling Right;Right Sidelying to Sit;Sitting - Scoot to Edge of Bed Rolling Right: 5: Supervision;With rail Right Sidelying to Sit: 5: Supervision;With rails;HOB flat Sitting - Scoot to Edge of Bed: 5: Supervision Details for Bed Mobility Assistance: cueing for sequence with cues to increase rolling to sidelying for ease of transfer Transfers Sit to Stand: 4: Min  guard;From bed;From toilet Stand to Sit: 4: Min guard;To toilet;To chair/3-in-1 Details for Transfer Assistance: cueing for hand placement and safety x 2 from bed and x 2 from toilet Ambulation/Gait Ambulation/Gait Assistance: 4: Min guard Ambulation Distance (Feet): 180 Feet Assistive device: Rolling walker Ambulation/Gait Assistance Details: max cues to step into RW and take longer strides Gait Pattern: Shuffle;Trunk flexed Gait velocity: decreased Stairs: No    Exercises General Exercises - Lower Extremity Long Arc Quad: AROM;Seated;Both;10 reps Hip Flexion/Marching: AROM;Seated;Both;10 reps Toe Raises: AROM;Seated;Both;10 reps Heel Raises: AROM;Seated;Both;10 reps   PT Diagnosis:    PT Problem List:   PT Treatment Interventions:     PT Goals (current goals can now be found in the care plan section)    Visit Information  Last PT Received On: 04/22/13 Assistance Needed: +1 History of Present Illness: With a hx of dementia, hld, htn, prior cva, and dm who presented to [facility] with cough, sob, and marked weakness that started 3-4 days prior to admit. He was then brought to the ED where a cxr demonstrated a new R sided PNA. No leukocytosis . Pt with flu    Subjective Data      Cognition  Cognition Arousal/Alertness: Awake/alert Behavior During Therapy: WFL for tasks assessed/performed Overall Cognitive Status: History of cognitive impairments - at baseline    Balance     End of Session PT - End of Session Equipment Utilized During Treatment: Gait belt Activity Tolerance: Patient tolerated treatment well Patient left: in chair;with call bell/phone within reach;with family/visitor present  Nurse Communication: Mobility status   GP     Delorse Lek 04/22/2013, 2:38 PM Delaney Meigs, PT [phone]

## 2013-04-23 ENCOUNTER — Inpatient Hospital Stay (HOSPITAL_COMMUNITY): Payer: Medicare Other

## 2013-04-23 DIAGNOSIS — G459 Transient cerebral ischemic attack, unspecified: Secondary | ICD-10-CM

## 2013-04-23 LAB — GLUCOSE, CAPILLARY
Glucose-Capillary: 161 mg/dL — ABNORMAL HIGH (ref 70–99)
Glucose-Capillary: 73 mg/dL (ref 70–99)

## 2013-04-23 MED ORDER — ALBUTEROL SULFATE (5 MG/ML) 0.5% IN NEBU
2.5000 mg | INHALATION_SOLUTION | Freq: Two times a day (BID) | RESPIRATORY_TRACT | Status: DC
  Administered 2013-04-23 – 2013-04-24 (×2): 2.5 mg via RESPIRATORY_TRACT
  Filled 2013-04-23 (×2): qty 0.5

## 2013-04-23 NOTE — Progress Notes (Signed)
During bedside report, patient found to have slurred speech and facial droop.  RR and MD was notified.  Code stroke called.  Will continue to monitor patient.
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
## 2013-04-23 NOTE — Progress Notes (Signed)
Patient exhibiting facial droop and slurred speech. Last seen normal 0533. Dr. Jerral Ralph aware orders received, rapid response here and code stroke called.

## 2013-04-23 NOTE — Progress Notes (Signed)
PATIENT DETAILS Name: Casey Whitney Age: 79 y.o. Sex: male Date of Birth: Oct 21, 1921 Admit Date: 04/20/2013 Admitting Physician Joseph Art, DO ZOX:[id number], Doris Cheadle, MD  Subjective: Much more alert this am. Noted by RN to left facial droop and mild dysarthria around 7:30 am this am, last seen normal around  5:30 am. CT Head neg.  Assessment/Plan: Principal Problem:   CAP (community acquired pneumonia) - Afebrile since admission, no leukocytosis. - Continue with Rocephin and Zithromax-day 4 - Could have some element of aspiration- since improving with current antibiotics, we'll continue - HIV negative, urine strep pneumonia and Legionella antigen negative.  - Blood cultures 12/18 no growth  Influenza - Influenza PCR positive for influenza A.- On Tamiflu  ?CVA -CT head negative -spoke with daughter at bedside-she thinks he is at his baseline, she claims that when he normally wakes up-his speech is somewhat difficult. -suspect even if CVA-further work up will not change management. Echo 07/28/12-EF 60-65. Carotid Doppler on 07/28/12 showed no significant stenosis. ? Repeat-but suspect it will not change what we do. -on ASA-will defer to Neuro if we can switch to Plavix  Chronic kidney disease stage III - Creatinine very close to her usual baseline. - Monitor electrolytes periodically.  Hypertension - BP stable, continue with amlodipine. -benazepril on hold was BP Soft. BP controlled with just amlodipine.  Diabetes - CBGs stable. - Continue with SSI, hold metformin.  Dyslipidemia - Continue with statin  Dementia with acute delirium - Pleasantly confused. - Continue with Aricept and Namenda.  Disposition: Remain inpatient- home with HHPT in am  DVT Prophylaxis: Prophylactic Heparin  Code Status: DNR  Family Communication Daughter/Spouse at bedside  Procedures:  None  CONSULTS:  None   MEDICATIONS: Scheduled Meds: . albuterol  2.5 mg  Nebulization TID  . amLODipine  5 mg Oral QPM  . aspirin  325 mg Oral Daily  . azithromycin  500 mg Oral q1800  . cefTRIAXone (ROCEPHIN)  IV  1 g Intravenous Q24H  . donepezil  10 mg Oral QHS  . ferrous sulfate  325 mg Oral BID  . heparin  5,000 Units Subcutaneous Q8H  . insulin aspart  0-15 Units Subcutaneous TID WC  . insulin aspart  0-5 Units Subcutaneous QHS  . Memantine HCl ER  28 mg Oral Daily  . oseltamivir  30 mg Oral BID  . oxybutynin  10 mg Oral Daily  . simvastatin  20 mg Oral q1800  . temazepam  15 mg Oral QHS   Continuous Infusions:   PRN Meds:.acetaminophen, acetaminophen, morphine injection, ondansetron (ZOFRAN) IV, ondansetron  Antibiotics: Anti-infectives   Start     Dose/Rate Route Frequency Ordered Stop   04/21/13 2200  oseltamivir (TAMIFLU) capsule 30 mg     30 mg Oral 2 times daily 04/21/13 1620 04/26/13 0959   04/21/13 1800  azithromycin (ZITHROMAX) tablet 500 mg     500 mg Oral Daily-1800 04/21/13 1340 04/27/13 1759   04/21/13 1000  oseltamivir (TAMIFLU) capsule 75 mg  Status:  Discontinued     75 mg Oral 2 times daily 04/21/13 0743 04/21/13 1620   04/20/13 1745  cefTRIAXone (ROCEPHIN) 1 g in dextrose 5 % 50 mL IVPB     1 g 100 mL/hr over 30 Minutes Intravenous Every 24 hours 04/20/13 1736 04/27/13 1744   04/20/13 1745  azithromycin (ZITHROMAX) 500 mg in dextrose 5 % 250 mL IVPB  Status:  Discontinued     500 mg 250 mL/hr over 60 Minutes Intravenous  Every 24 hours 04/20/13 1736 04/21/13 1339   04/20/13 1245  levofloxacin (LEVAQUIN) IVPB 750 mg     750 mg 100 mL/hr over 90 Minutes Intravenous  Once 04/20/13 1240 04/20/13 1532       PHYSICAL EXAM: Vital signs in last 24 hours: Filed Vitals:   04/22/13 1731 04/22/13 2155 04/23/13 0533 04/23/13 0944  BP: 113/64 112/61 133/71   Pulse:  68 70   Temp:  98 F (36.7 C) 98.5 F (36.9 C)   TempSrc:  Oral Oral   Resp:  18 18   Height:      Weight:      SpO2:  94% 93% 92%    Weight change:  Filed  Weights   04/20/13 1638  Weight: 68.629 kg (151 lb 4.8 oz)   Body mass index is 29.55 kg/(m^2).   Gen Exam:Pleasantly confused. Minimal left facial droop, minimal dysarthira Neck: Supple, No JVD.   Chest: Good air entry Bibasilar rales CVS: S1 S2 Regular, no murmurs.  Abdomen: soft, BS +, non tender, non distended.  Extremities: no edema, lower extremities warm to touch. Neurologic: Non Focal.  Skin: No Rash.  Wounds: N/A.    Intake/Output from previous day:  Intake/Output Summary (Last 24 hours) at 04/23/13 1058 Last data filed at 04/23/13 1610  Gross per 24 hour  Intake    120 ml  Output    800 ml  Net   -680 ml     LAB RESULTS: CBC  Recent Labs Lab 04/20/13 1220 04/20/13 1856 04/21/13 0630  WBC 8.7 8.0 7.6  HGB 12.2* 10.6* 11.5*  HCT 36.7* 30.5* 34.5*  PLT 169 155 163  MCV 91.3 89.4 90.8  MCH 30.3 31.1 30.3  MCHC 33.2 34.8 33.3  RDW 14.4 14.7 14.7  LYMPHSABS 1.2  --   --   MONOABS 0.5  --   --   EOSABS 0.0  --   --   BASOSABS 0.0  --   --     Chemistries   Recent Labs Lab 04/20/13 1220 04/20/13 1856 04/21/13 0630 04/22/13 0600  NA 133*  --  134* 137  K 4.4  --  4.0 3.8  CL 96  --  101 104  CO2 24  --  23 22  GLUCOSE 273*  --  108* 102*  BUN 24*  --  19 19  CREATININE 1.50* 1.40* 1.53* 1.53*  CALCIUM 9.6  --  8.7 8.1*    CBG:  Recent Labs Lab 04/22/13 0756 04/22/13 1231 04/22/13 1650 04/22/13 2153 04/23/13 0724  GLUCAP 103* 161* 138* 100* 138*    GFR Estimated Creatinine Clearance: 26.6 ml/min (by C-G formula based on Cr of 1.53).  Coagulation profile No results found for this basename: INR, PROTIME,  in the last 168 hours  Cardiac Enzymes  Recent Labs Lab 04/20/13 1220  TROPONINI <0.30    No components found with this basename: POCBNP,  No results found for this basename: DDIMER,  in the last 72 hours  Recent Labs  04/21/13 0630  HGBA1C 7.8*   No results found for this basename: CHOL, HDL, LDLCALC, TRIG, CHOLHDL,  LDLDIRECT,  in the last 72 hours No results found for this basename: TSH, T4TOTAL, FREET3, T3FREE, THYROIDAB,  in the last 72 hours No results found for this basename: VITAMINB12, FOLATE, FERRITIN, TIBC, IRON, RETICCTPCT,  in the last 72 hours No results found for this basename: LIPASE, AMYLASE,  in the last 72 hours  Urine Studies No results found  for this basename: UACOL, UAPR, USPG, UPH, UTP, UGL, UKET, UBIL, UHGB, UNIT, UROB, ULEU, UEPI, UWBC, URBC, UBAC, CAST, CRYS, UCOM, BILUA,  in the last 72 hours  MICROBIOLOGY: Recent Results (from the past 240 hour(s))  CULTURE, BLOOD (ROUTINE X 2)     Status: None   Collection Time    04/20/13  1:40 PM      Result Value Range Status   Specimen Description BLOOD LEFT HAND   Final   Special Requests BOTTLES DRAWN AEROBIC AND ANAEROBIC 5CC BOTH   Final   Culture  Setup Time     Final   Value: 04/20/2013 20:44     Performed at [facility]   Culture     Final   Value:        BLOOD CULTURE RECEIVED NO GROWTH TO DATE CULTURE WILL BE HELD FOR 5 DAYS BEFORE ISSUING A FINAL NEGATIVE REPORT     Performed at [facility]   Report Status PENDING   Incomplete  CULTURE, BLOOD (ROUTINE X 2)     Status: None   Collection Time    04/20/13  1:55 PM      Result Value Range Status   Specimen Description BLOOD RESISTANT WRIST   Final   Special Requests BOTTLES DRAWN AEROBIC AND ANAEROBIC 5CC BOTH   Final   Culture  Setup Time     Final   Value: 04/20/2013 20:44     Performed at [facility]   Culture     Final   Value:        BLOOD CULTURE RECEIVED NO GROWTH TO DATE CULTURE WILL BE HELD FOR 5 DAYS BEFORE ISSUING A FINAL NEGATIVE REPORT     Performed at [facility]   Report Status PENDING   Incomplete  CULTURE, BLOOD (ROUTINE X 2)     Status: None   Collection Time    04/20/13  6:56 PM      Result Value Range Status   Specimen Description BLOOD ARM LEFT   Final   Special Requests BOTTLES DRAWN AEROBIC ONLY 3CC   Final    Culture  Setup Time     Final   Value: 04/21/2013 02:08     Performed at [facility]   Culture     Final   Value:        BLOOD CULTURE RECEIVED NO GROWTH TO DATE CULTURE WILL BE HELD FOR 5 DAYS BEFORE ISSUING A FINAL NEGATIVE REPORT     Performed at [facility]   Report Status PENDING   Incomplete  CULTURE, BLOOD (ROUTINE X 2)     Status: None   Collection Time    04/20/13  7:03 PM      Result Value Range Status   Specimen Description BLOOD ARM LEFT   Final   Special Requests BOTTLES DRAWN AEROBIC ONLY 5CC   Final   Culture  Setup Time     Final   Value: 04/21/2013 02:08     Performed at [facility]   Culture     Final   Value:        BLOOD CULTURE RECEIVED NO GROWTH TO DATE CULTURE WILL BE HELD FOR 5 DAYS BEFORE ISSUING A FINAL NEGATIVE REPORT     Performed at [facility]   Report Status PENDING   Incomplete  GRAM STAIN     Status: None   Collection Time    04/21/13  4:04 AM  Result Value Range Status   Specimen Description URINE, RANDOM   Final   Special Requests NONE   Final   Gram Stain     Final   Value: CYTOSPIN SLIDE     WBC PRESENT,BOTH PMN AND MONONUCLEAR     GRAM POSITIVE RODS   Report Status 04/21/2013 FINAL   Final    RADIOLOGY STUDIES/RESULTS: Dg Chest 2 View  04/20/2013   CLINICAL DATA:  Cough and chest congestion.  EXAM: CHEST  2 VIEW  COMPARISON:  07/27/2012  FINDINGS: Central peribronchial thickening again seen bilaterally. There is mild asymmetric airspace disease in the central right upper lobe, suspicious for pneumonia. No other sites of pulmonary airspace disease or consolidation noted. No evidence of pleural effusion. Heart size is stable and within normal limits.  IMPRESSION: New airspace disease in central right upper lobe, suspicious for pneumonia. Recommend short-term radiographic followup in several weeks to confirm resolution.   Electronically Signed   By: Myles Rosenthal M.D.   On: 04/20/2013 13:21     Jeoffrey Massed, MD  [facility] Pager:[phone]  If 7PM-7AM, please contact night-coverage [email or web address] Password [email or web address] 04/23/2013, 10:58 AM   LOS: 3 days

## 2013-04-23 NOTE — Significant Event (Signed)
Rapid Response Event Note  Overview: Time Called: 0714 Arrival Time: 0716    Initial Focused Assessment called by primary RN for patient with new facial droop and slurred speech.     Interventions: NIHSS 1, Dr. Jerral Ralph called and notified, Code stroke called, Dr. Thad Ranger at bedside.  Patient to CT scan.   Event Summary:   at      at          [facility]
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
## 2013-04-24 ENCOUNTER — Inpatient Hospital Stay (HOSPITAL_COMMUNITY): Payer: Medicare Other

## 2013-04-24 DIAGNOSIS — J9600 Acute respiratory failure, unspecified whether with hypoxia or hypercapnia: Secondary | ICD-10-CM

## 2013-04-24 LAB — GLUCOSE, CAPILLARY
Glucose-Capillary: 132 mg/dL — ABNORMAL HIGH (ref 70–99)
Glucose-Capillary: 158 mg/dL — ABNORMAL HIGH (ref 70–99)

## 2013-04-24 LAB — LIPID PANEL
Cholesterol: 95 mg/dL (ref 0–200)
HDL: 22 mg/dL — ABNORMAL LOW (ref >39)
LDL Cholesterol: 47 mg/dL (ref 0–99)
Triglycerides: 132 mg/dL (ref <150)
VLDL: 26 mg/dL (ref 0–40)

## 2013-04-24 MED ORDER — PREDNISONE 20 MG PO TABS
40.0000 mg | ORAL_TABLET | Freq: Every day | ORAL | Status: DC
  Administered 2013-04-24 – 2013-04-25 (×2): 40 mg via ORAL
  Filled 2013-04-24 (×3): qty 2

## 2013-04-24 MED ORDER — ALBUTEROL SULFATE (5 MG/ML) 0.5% IN NEBU
2.5000 mg | INHALATION_SOLUTION | Freq: Four times a day (QID) | RESPIRATORY_TRACT | Status: DC
  Administered 2013-04-24 – 2013-04-25 (×3): 2.5 mg via RESPIRATORY_TRACT
  Filled 2013-04-24 (×3): qty 0.5

## 2013-04-24 MED ORDER — WHITE PETROLATUM GEL
Status: AC
  Administered 2013-04-24: 17:00:00
  Filled 2013-04-24: qty 5

## 2013-04-24 NOTE — Clinical Social Work Note (Signed)
Patient refusing SNF. CSW signing off at this time.   Roddie Mc, [place], [place], [phone]

## 2013-04-24 NOTE — Progress Notes (Signed)
Physical Therapy Treatment Patient Details Name: Casey Whitney MRN: [id number] DOB: February 28, 1922 Today's Date: 04/24/2013 Time: 6213-0865 PT Time Calculation (min): 24 min  PT Assessment / Plan / Recommendation  History of Present Illness With a hx of dementia, hld, htn, prior cva, and dm who presented to [facility] with cough, sob, and marked weakness that started 3-4 days prior to admit. He was then brought to the ED where a cxr demonstrated a new R sided PNA. No leukocytosis . Pt with flu   PT Comments   This treatment was focused on proper breathing technique with bed mobility and sitting EOB while assessing  o2 required for mobility.  Respiratory therapist in to see pt with machine for chest percussing.  Assisted her and pt with rolling and donning of vest so that pt can have treatment prior to gait due to o2 89-90% on 2 L/min.  Pt education on proper breathing techniques while seated EOB.  To return later for gait.  Follow Up Recommendations  Home health PT;Supervision/Assistance - 24 hour (Pt refusing SNF)     Does the patient have the potential to tolerate intense rehabilitation     Barriers to Discharge        Equipment Recommendations  None recommended by PT    Recommendations for Other Services    Frequency Min 3X/week   Progress towards PT Goals Progress towards PT goals: Progressing toward goals  Plan Current plan remains appropriate    Precautions / Restrictions Precautions Precautions: Fall   Pertinent Vitals/Pain 89-90% in supine on 2L/min. 91% sitting at EOB on 2 L/min    Mobility  Bed Mobility Bed Mobility: Scooting to HOB;Supine to Sit;Sit to Supine Supine to Sit: 5: Supervision;HOB elevated;With rails Sitting - Scoot to Edge of Bed: 5: Supervision Sit to Supine: 5: Supervision;HOB flat Scooting to HOB: 5: Supervision;With rail    Exercises     PT Diagnosis:    PT Problem List:   PT Treatment Interventions:     PT Goals (current goals can now be found in  the care plan section) Acute Rehab PT Goals Patient Stated Goal: get better  Visit Information  Last PT Received On: 04/24/13 Assistance Needed: +1 History of Present Illness: With a hx of dementia, hld, htn, prior cva, and dm who presented to [name] with cough, sob, and marked weakness that started 3-4 days prior to admit. He was then brought to the ED where a cxr demonstrated a new R sided PNA. No leukocytosis . Pt with flu    Subjective Data  Subjective: Pt supine in bed with o2 intact Patient Stated Goal: get better   Cognition  Cognition Arousal/Alertness: Awake/alert Behavior During Therapy: WFL for tasks assessed/performed Overall Cognitive Status: History of cognitive impairments - at baseline    Balance  Static Sitting Balance Static Sitting - Balance Support: Bilateral upper extremity supported Static Sitting - Level of Assistance: 5: Stand by assistance  End of Session PT - End of Session Activity Tolerance: Patient tolerated treatment well Patient left: in bed;with nursing/sitter in room Nurse Communication:  (o2 sats)   GP     [facility] LUBECK 04/24/2013, 12:45 PM

## 2013-04-24 NOTE — Progress Notes (Signed)
Physical Therapy Treatment Patient Details Name: Casey Whitney MRN: [id number] DOB: March 03, 1922 Today's Date: 04/24/2013 Time: 1152-1208 PT Time Calculation (min): 16 min  PT Assessment / Plan / Recommendation  History of Present Illness With a hx of dementia, hld, htn, prior cva, and dm who presented to [facility] with cough, sob, and marked weakness that started 3-4 days prior to admit. He was then brought to the ED where a cxr demonstrated a new R sided PNA. No leukocytosis . Pt with flu   PT Comments   Pt seen for gait after respiratory treatment.  Ambulated with RW MIN/guard for 100' with flexed knees in stance with o2 98% taken 1-2 minutes after gait.  Pt states that he knows that going to a SNF may be the best thing for him, but he doesn't want to go to one.  Recommend HHPT and 24 hour S/A.  Follow Up Recommendations  Home health PT;Supervision/Assistance - 24 hour     Does the patient have the potential to tolerate intense rehabilitation     Barriers to Discharge        Equipment Recommendations  None recommended by PT    Recommendations for Other Services    Frequency Min 3X/week   Progress towards PT Goals Progress towards PT goals: Progressing toward goals  Plan Current plan remains appropriate (refusesSNF)    Precautions / Restrictions Precautions Precautions: Fall   Pertinent Vitals/Pain o2 98% on 2 L/min after gait    Mobility  Bed Mobility Bed Mobility: Supine to Sit Supine to Sit: 5: Supervision;HOB elevated;With rails Sitting - Scoot to Edge of Bed: 5: Supervision Transfers: Sit to Stand Sit to Stand: 4: Min guard Stand to Sit: 4: Min guard;To chair/3-in-1 Details for Transfer Assistance: cues for hand placement and foot placement for achieving initial standing balance. Ambulation/Gait Ambulation/Gait Assistance: 4: Min guard Ambulation Distance (Feet): 80 Feet Assistive device: Rolling walker Ambulation/Gait Assistance Details: Pt with increased knee  flexion during stance but no buckeling noted.   Gait Pattern: Trunk flexed;Right flexed knee in stance;Left flexed knee in stance Gait velocity: decreased    Exercises     PT Diagnosis:    PT Problem List:   PT Treatment Interventions:     PT Goals (current goals can now be found in the care plan section) Acute Rehab PT Goals Patient Stated Goal: Go home as soon as he can.  Visit Information  Last PT Received On: 04/24/13 Assistance Needed: +1 History of Present Illness: With a hx of dementia, hld, htn, prior cva, and dm who presented to [facility] with cough, sob, and marked weakness that started 3-4 days prior to admit. He was then brought to the ED where a cxr demonstrated a new R sided PNA. No leukocytosis . Pt with flu    Subjective Data  Subjective: Pt agreeable to walk.  Patient Stated Goal: Go home as soon as he can.   Cognition  Cognition Arousal/Alertness: Awake/alert Behavior During Therapy: WFL for tasks assessed/performed Overall Cognitive Status: History of cognitive impairments - at baseline    Insurance risk surveyor Sitting - Balance Support: Bilateral upper extremity supported Static Sitting - Level of Assistance: 5: Stand by assistance  End of Session PT - End of Session Equipment Utilized During Treatment: Gait belt Activity Tolerance: Patient tolerated treatment well Patient left: in chair;with chair alarm set Nurse Communication:  (o2 sats)   GP     Apoorva Bugay LUBECK 04/24/2013, 1:00 PM

## 2013-04-24 NOTE — Progress Notes (Signed)
Speech Language Pathology Treatment: Dysphagia  Patient Details Name: Casey Whitney MRN: [id number] DOB: 09-19-1921 Today's Date: 04/24/2013 Time: 4098-1191 SLP Time Calculation (min): 24 min  Assessment / Plan / Recommendation Clinical Impression  Received phone call from PA for f/u re: dysphagia, with question of aspiration.  Previous CXR with RUL pna.  (aspiration is typically in RLL).  Observed pt eating lunch.  Pocketing of food noted on left, with anterior spillage from left oral cavity.  Pt. With reduced sensation on left.  Clears pocketing with verbal cues.  No coughing or other overt s/s of aspiration observed during meal, however, silent aspiration can not be detected at bedside.  If continued ? Re: aspiration, an objective evaluation may be indicated.   HPI HPI: 79 yo male adm to [facility] with weakness x3-4 days, dementia, CVA, + cough, nocturnal wandering.  Pt tested + for influenza and CXR showed right upper lobe airspace dx.  Pt is afebrile, 2 liters and lungs are decreased/rhonchorous.    BSE, ordered to aid in determining diet recommendation, was completed 12/19.  Rec dys 3, ground meat, thin liquids no straws.     Pertinent Vitals Rhonchi; CXR pending; afebrile.  SLP Plan  Continue with current plan of care    Recommendations Diet recommendations: Dysphagia 3 (mechanical soft);Thin liquid (Pureed meats) Liquids provided via: Cup;No straw Medication Administration: Whole meds with puree Supervision: Patient able to self feed;Staff to assist with self feeding Compensations: Slow rate;Small sips/bites;Check for pocketing Postural Changes and/or Swallow Maneuvers: Seated upright 90 degrees;Upright 30-60 min after meal              Oral Care Recommendations: Oral care BID Follow up Recommendations: 24 hour supervision/assistance Plan: Continue with current plan of care    GO     Maryjo Rochester T 04/24/2013, 1:30 PM

## 2013-04-24 NOTE — Progress Notes (Signed)
PATIENT DETAILS Name: Casey Whitney Age: 79 y.o. Sex: male Date of Birth: [birth year] Admit Date: 04/20/2013 Admitting Physician Joseph Art, DO NFA:OZHYQ, Doris Cheadle, MD  Subjective: No complaints, reports he has phlegm in his mouth.  Looking forward to going home.  Assessment/Plan: Principal Problem:    CAP (community acquired pneumonia) - Afebrile since admission - Continue with Rocephin and Zithromax - Patient desaturating into the 80s at rest.  Lungs have increased Rales.  Will check follow up CXR 12/22. - Could have some element of aspiration.  Speech is following.  Currently D3 diet with aspiration precautions. - HIV negative, urine strep pneumonia and Legionella antigen negative.  - Blood cultures 12/18 no growth  Influenza - Influenza PCR positive for influenza A.- On Tamiflu  Acute Respiratory Failure-Hypoxic -secondary to above -wheezing this am -start on scheduled nebs, incentive spirometry, flutter valve -may need short prednisone burst  ?CVA -CT head negative -spoke with daughter at bedside-she thinks he is at his baseline, she claims that when he normally wakes up-his speech is somewhat difficult. -suspect even if CVA-further work up will not change management. Echo 07/28/12-EF 60-65. Carotid Doppler on 07/28/12 showed no significant stenosis. ? Repeat-but suspect it will not change what we do. -on ASA-will defer to Neuro if we can switch to Plavix -Appreciate neuro consultation. Head CT Negative for CVA.   MRI brain pending.  Chronic kidney disease stage III - Creatinine close to usual baseline. - Monitor electrolytes periodically.  Hypertension - BP stable, continue with amlodipine. - benazepril on hold was BP Soft. BP controlled with just amlodipine.  Diabetes - CBGs stable. - Continue with SSI, hold metformin.  Dyslipidemia - Continue with statin  Dementia with acute delirium - Pleasantly confused. - Continue with Aricept and  Namenda.  Disposition: Remain inpatient- home with HHPT when appropriate.  Patient refused SNF.  DVT Prophylaxis: Prophylactic Heparin  Code Status: DNR  Family Communication Talked with Ms Manual Meier by phone.  She is taking care of her mother who has the flu as well.     MEDICATIONS: Scheduled Meds: . albuterol  2.5 mg Nebulization Q6H  . amLODipine  5 mg Oral QPM  . aspirin  325 mg Oral Daily  . azithromycin  500 mg Oral q1800  . cefTRIAXone (ROCEPHIN)  IV  1 g Intravenous Q24H  . donepezil  10 mg Oral QHS  . ferrous sulfate  325 mg Oral BID  . heparin  5,000 Units Subcutaneous Q8H  . insulin aspart  0-15 Units Subcutaneous TID WC  . insulin aspart  0-5 Units Subcutaneous QHS  . Memantine HCl ER  28 mg Oral Daily  . oseltamivir  30 mg Oral BID  . oxybutynin  10 mg Oral Daily  . simvastatin  20 mg Oral q1800  . temazepam  15 mg Oral QHS   Continuous Infusions:   PRN Meds:.acetaminophen, acetaminophen, morphine injection, ondansetron (ZOFRAN) IV, ondansetron  Antibiotics: Anti-infectives   Start     Dose/Rate Route Frequency Ordered Stop   04/21/13 2200  oseltamivir (TAMIFLU) capsule 30 mg     30 mg Oral 2 times daily 04/21/13 1620 04/26/13 0959   04/21/13 1800  azithromycin (ZITHROMAX) tablet 500 mg     500 mg Oral Daily-1800 04/21/13 1340 04/27/13 1759   04/21/13 1000  oseltamivir (TAMIFLU) capsule 75 mg  Status:  Discontinued     75 mg Oral 2 times daily 04/21/13 0743 04/21/13 1620   04/20/13 1745  cefTRIAXone (ROCEPHIN) 1 g in  dextrose 5 % 50 mL IVPB     1 g 100 mL/hr over 30 Minutes Intravenous Every 24 hours 04/20/13 1736 04/27/13 1744   04/20/13 1745  azithromycin (ZITHROMAX) 500 mg in dextrose 5 % 250 mL IVPB  Status:  Discontinued     500 mg 250 mL/hr over 60 Minutes Intravenous Every 24 hours 04/20/13 1736 04/21/13 1339   04/20/13 1245  levofloxacin (LEVAQUIN) IVPB 750 mg     750 mg 100 mL/hr over 90 Minutes Intravenous  Once 04/20/13 1240 04/20/13 1532        PHYSICAL EXAM: Vital signs in last 24 hours: Filed Vitals:   04/23/13 0944 04/23/13 1357 04/24/13 0526 04/24/13 0806  BP:  113/51 138/72   Pulse:  71  64  Temp:  98 F (36.7 C) 97.7 F (36.5 C)   TempSrc:  Oral Oral   Resp:  18 18 18   Height:      Weight:      SpO2: 92% 91% 94% 90%    Weight change:  Filed Weights   04/20/13 1638  Weight: 68.629 kg (151 lb 4.8 oz)   Body mass index is 29.55 kg/(m^2).   Gen Exam:Pleasantly confused. Awake, alert, NAD Neck: Supple, No JVD.   Chest:  Bibasilar rales, does not clear to cough, cough is productive. CVS: S1 S2 Regular, no murmurs.  Abdomen: soft, BS +, non tender, non distended.  Extremities: no edema, lower extremities warm to touch. Neurologic: Non Focal.    Intake/Output from previous day:  Intake/Output Summary (Last 24 hours) at 04/24/13 1312 Last data filed at 04/24/13 1009  Gross per 24 hour  Intake   1460 ml  Output      0 ml  Net   1460 ml     LAB RESULTS: CBC  Recent Labs Lab 04/20/13 1220 04/20/13 1856 04/21/13 0630  WBC 8.7 8.0 7.6  HGB 12.2* 10.6* 11.5*  HCT 36.7* 30.5* 34.5*  PLT 169 155 163  MCV 91.3 89.4 90.8  MCH 30.3 31.1 30.3  MCHC 33.2 34.8 33.3  RDW 14.4 14.7 14.7  LYMPHSABS 1.2  --   --   MONOABS 0.5  --   --   EOSABS 0.0  --   --   BASOSABS 0.0  --   --     Chemistries   Recent Labs Lab 04/20/13 1220 04/20/13 1856 04/21/13 0630 04/22/13 0600  NA 133*  --  134* 137  K 4.4  --  4.0 3.8  CL 96  --  101 104  CO2 24  --  23 22  GLUCOSE 273*  --  108* 102*  BUN 24*  --  19 19  CREATININE 1.50* 1.40* 1.53* 1.53*  CALCIUM 9.6  --  8.7 8.1*    CBG:  Recent Labs Lab 04/23/13 1204 04/23/13 1727 04/23/13 2203 04/24/13 0752 04/24/13 1148  GLUCAP 104* 161* 73 132* 165*     Cardiac Enzymes  Recent Labs Lab 04/20/13 1220  TROPONINI <0.30     Recent Labs  04/24/13 0625  CHOL 95  HDL 22*  LDLCALC 47  TRIG 409  CHOLHDL 4.3     MICROBIOLOGY: Recent Results (from the past 240 hour(s))  CULTURE, BLOOD (ROUTINE X 2)     Status: None   Collection Time    04/20/13  1:40 PM      Result Value Range Status   Specimen Description BLOOD LEFT HAND   Final   Special Requests BOTTLES DRAWN AEROBIC AND  ANAEROBIC 5CC BOTH   Final   Culture  Setup Time     Final   Value: 04/20/2013 20:44     Performed at [facility]   Culture     Final   Value:        BLOOD CULTURE RECEIVED NO GROWTH TO DATE CULTURE WILL BE HELD FOR 5 DAYS BEFORE ISSUING A FINAL NEGATIVE REPORT     Performed at [facility]   Report Status PENDING   Incomplete  CULTURE, BLOOD (ROUTINE X 2)     Status: None   Collection Time    04/20/13  1:55 PM      Result Value Range Status   Specimen Description BLOOD RESISTANT WRIST   Final   Special Requests BOTTLES DRAWN AEROBIC AND ANAEROBIC 5CC BOTH   Final   Culture  Setup Time     Final   Value: 04/20/2013 20:44     Performed at [facility]   Culture     Final   Value:        BLOOD CULTURE RECEIVED NO GROWTH TO DATE CULTURE WILL BE HELD FOR 5 DAYS BEFORE ISSUING A FINAL NEGATIVE REPORT     Performed at [facility]   Report Status PENDING   Incomplete  CULTURE, BLOOD (ROUTINE X 2)     Status: None   Collection Time    04/20/13  6:56 PM      Result Value Range Status   Specimen Description BLOOD ARM LEFT   Final   Special Requests BOTTLES DRAWN AEROBIC ONLY 3CC   Final   Culture  Setup Time     Final   Value: 04/21/2013 02:08     Performed at [facility]   Culture     Final   Value:        BLOOD CULTURE RECEIVED NO GROWTH TO DATE CULTURE WILL BE HELD FOR 5 DAYS BEFORE ISSUING A FINAL NEGATIVE REPORT     Performed at [facility]   Report Status PENDING   Incomplete  CULTURE, BLOOD (ROUTINE X 2)     Status: None   Collection Time    04/20/13  7:03 PM      Result Value Range Status   Specimen Description BLOOD ARM LEFT   Final   Special Requests  BOTTLES DRAWN AEROBIC ONLY 5CC   Final   Culture  Setup Time     Final   Value: 04/21/2013 02:08     Performed at [facility]   Culture     Final   Value:        BLOOD CULTURE RECEIVED NO GROWTH TO DATE CULTURE WILL BE HELD FOR 5 DAYS BEFORE ISSUING A FINAL NEGATIVE REPORT     Performed at [facility]   Report Status PENDING   Incomplete  GRAM STAIN     Status: None   Collection Time    04/21/13  4:04 AM      Result Value Range Status   Specimen Description URINE, RANDOM   Final   Special Requests NONE   Final   Gram Stain     Final   Value: CYTOSPIN SLIDE     WBC PRESENT,BOTH PMN AND MONONUCLEAR     GRAM POSITIVE RODS   Report Status 04/21/2013 FINAL   Final    RADIOLOGY STUDIES/RESULTS: Dg Chest 2 View  04/20/2013   CLINICAL DATA:  Cough and chest congestion.  EXAM: CHEST  2 VIEW  COMPARISON:  07/27/2012  FINDINGS: Central peribronchial thickening again seen bilaterally. There is mild asymmetric airspace disease in the central right upper lobe, suspicious for pneumonia. No other sites of pulmonary airspace disease or consolidation noted. No evidence of pleural effusion. Heart size is stable and within normal limits.  IMPRESSION: New airspace disease in central right upper lobe, suspicious for pneumonia. Recommend short-term radiographic followup in several weeks to confirm resolution.   Electronically Signed   By: Myles Rosenthal M.D.   On: 04/20/2013 13:21    [place] [facility] Pager:[phone]  If 7PM-7AM, please contact night-coverage [email or web address] Password [email or web address] 04/24/2013, 1:12 PM   LOS: 4 days   Attending Patient seen and examined, agree with the above assessment and plan as outlined above. Still wheezing, intensify nebulizer treatment. Await MRI Brain. Suspect home in next 1-2 days.  Windell Norfolk MD

## 2013-04-24 NOTE — Progress Notes (Signed)
Occupational Therapy Treatment Patient Details Name: Casey Whitney MRN: [id number] DOB: 1921-11-10 Today's Date: 04/24/2013 Time: 7829-5621 OT Time Calculation (min): 35 min  OT Assessment / Plan / Recommendation  History of present illness With a hx of dementia, hld, htn, prior cva, and dm who presented to [facility] with cough, sob, and marked weakness that started 3-4 days prior to admit. He was then brought to the ED where a cxr demonstrated a new R sided PNA. No leukocytosis . Pt with flu   OT comments  Pt is currently min assist to min guard for bathing and dressing tasks as well as transfers.  Baseline cognition with history of dementia.  Oxygen sats remained greater than 90-91% on room air during bathing task but were 94% at rest on 2Ls.  Pt making steady gains functionally but secondary to fall risk and history of dementia, will need 24 hour supervision.    Follow Up Recommendations  Home health OT       Equipment Recommendations  None recommended by OT       Frequency Min 2X/week   Progress towards OT Goals Progress towards OT goals: Progressing toward goals  Plan Discharge plan remains appropriate    Precautions / Restrictions Precautions Precautions: Fall Precaution Comments: history of dementia Restrictions Weight Bearing Restrictions: No   Pertinent Vitals/Pain No report of pain, O2 90 -91% on room air, 94% on 2Ls at rest    ADL  Grooming: Performed;Wash/dry hands;Wash/dry face;Supervision/safety Where Assessed - Grooming: Unsupported sitting Upper Body Bathing: Performed;Supervision/safety Where Assessed - Upper Body Bathing: Unsupported sitting Lower Body Bathing: Performed;Minimal assistance Where Assessed - Lower Body Bathing: Supported sit to stand Upper Body Dressing: Simulated;Set up Where Assessed - Upper Body Dressing: Unsupported sitting Lower Body Dressing: Performed;Supervision/safety Where Assessed - Lower Body Dressing: Unsupported sit to  stand Toilet Transfer: Simulated;Minimal assistance Toilet Transfer Method: Other (comment) (ambulate with the RW) Toilet Transfer Equipment: Other (comment) (simulated to bedside chair) Toileting - Clothing Manipulation and Hygiene: Simulated;Minimal assistance Where Assessed - Glass blower/designer Manipulation and Hygiene: Other (comment) (sit to stand from bedside chair) Transfers/Ambulation Related to ADLs: Pt is currently min assist to min guard assist for transfers using the RW.  Needed mod instructional cueing to stay inside of the walker and keep both hands on it as he tried to grasp hold of the sink when walking over to perform his bathing. ADL Comments: Pt with flexed knees in standing and forward posture.  Min assist sit to stand with mod instructional cueing to push up from the surface he is sitting on instead of pulling up on the sink.  Pt with increased difficutly reaching feet for washing but was able to reach all the way down to the floor to donn new gripper socks, with increased time.  Pt's daughter present and reports that pt has 24 hour supervision at discharge between her and her sisters.  She reported that pt was waited on a lot by his wife and they also assisted with things probaly more than they should with reference to bathing tasks.       OT Goals(current goals can now be found in the care plan section) Acute Rehab OT Goals Patient Stated Goal: Pt did not state but was very agreeable to wash up at the sink.  Visit Information  Last OT Received On: 04/24/13 Assistance Needed: +1 History of Present Illness: With a hx of dementia, hld, htn, prior cva, and dm who presented to [facility] with cough, sob, and marked  weakness that started 3-4 days prior to admit. He was then brought to the ED where a cxr demonstrated a new R sided PNA. No leukocytosis . Pt with flu          Cognition  Cognition Arousal/Alertness: Awake/alert Behavior During Therapy: WFL for tasks  assessed/performed Overall Cognitive Status: History of cognitive impairments - at baseline    Mobility  Bed Mobility Bed Mobility: Supine to Sit Rolling Right: 5: Supervision;With rail Supine to Sit: 5: Supervision;HOB elevated;With rails Sitting - Scoot to Edge of Bed: 5: Supervision Sit to Supine: 5: Supervision;HOB flat Scooting to HOB: 5: Supervision;With rail Transfers Transfers: Sit to Stand;Stand to Sit Sit to Stand: 4: Min guard;From chair/3-in-1 Stand to Sit: 4: Min guard;To chair/3-in-1 Details for Transfer Assistance: Mod instructional cuieng needed for hand placement during transfers.       Balance Balance Balance Assessed: Yes Static Sitting Balance Static Sitting - Balance Support: No upper extremity supported Static Sitting - Level of Assistance: 5: Stand by assistance Static Sitting - Comment/# of Minutes: Pt able to stand for 2 mins during bathing of peri area.   End of Session OT - End of Session Equipment Utilized During Treatment: Rolling walker Activity Tolerance: Patient tolerated treatment well Patient left: in chair;with nursing/sitter in room;with family/visitor present;Other (comment) (Pt sitting at the sink and nursing to assist with shaving) Nurse Communication: Mobility status     Yasseen Salls OTR/L 04/24/2013, 3:37 PM

## 2013-04-24 NOTE — Care Management Note (Signed)
    Page 1 of 2   04/25/2013     4:15:27 PM   CARE MANAGEMENT NOTE 04/25/2013  Patient:  OGLE, HOEFFNER   Account Number:  [id number]  Date Initiated:  04/24/2013  Documentation initiated by:  Letha Cape  Subjective/Objective Assessment:   dx cap  admit- lives with daughter.     Action/Plan:   pt eval - rec hhpt with 24 hr,   Anticipated DC Date:  04/25/2013   Anticipated DC Plan:  HOME W HOME HEALTH SERVICES      DC Planning Services  CM consult      [facility] Choice  HOME HEALTH   Choice offered to / List presented to:  C-4 Adult Children        HH arranged  HH-1 RN  HH-2 PT  HH-3 OT  HH-6 SOCIAL WORKER      HH agency  [facility]   Status of service:  Completed, signed off Medicare Important Message given?   (If response is "NO", the following Medicare IM given date fields will be blank) Date Medicare IM given:   Date Additional Medicare IM given:    Discharge Disposition:  HOME W HOME HEALTH SERVICES  Per UR Regulation:  Reviewed for med. necessity/level of care/duration of stay  If discussed at Long Length of Stay Meetings, dates discussed:    Comments:  04/25/13 11:23 Letha Cape RN, BSN [phone] patient is for possible dc today, per MD RN is to check ambulatory saturations to make sure patient does not need home oxygen.  HH services set up, but NCM awaiting to see if patient will need home oxygen.  Per MD patient does not need home oxygen.  NCM notified [facility] patient is for dc today.  04/24/13 15:44 Letha Cape RN, BSN [phone] patient and his wife lives with daughter, Westley Hummer [phone].  Patient does not want to go to snf, Charlene chose [facility] from agency list for [facility], PT, OT and social worker. Soc will begin 24-48 hrs post discharge.  Patient for possible dc tomorrow.

## 2013-04-25 DIAGNOSIS — R7309 Other abnormal glucose: Secondary | ICD-10-CM

## 2013-04-25 LAB — BASIC METABOLIC PANEL
BUN: 24 mg/dL — ABNORMAL HIGH (ref 6–23)
Calcium: 9 mg/dL (ref 8.4–10.5)
Creatinine, Ser: 1.24 mg/dL (ref 0.50–1.35)
GFR calc non Af Amer: 50 mL/min — ABNORMAL LOW (ref >90)
Glucose, Bld: 222 mg/dL — ABNORMAL HIGH (ref 70–99)
Potassium: 4.7 mEq/L (ref 3.5–5.1)

## 2013-04-25 LAB — CBC
Hemoglobin: 11.1 g/dL — ABNORMAL LOW (ref 13.0–17.0)
MCH: 30.2 pg (ref 26.0–34.0)
MCHC: 33.9 g/dL (ref 30.0–36.0)
Platelets: 186 10*3/uL (ref 150–400)
RDW: 14.5 % (ref 11.5–15.5)

## 2013-04-25 LAB — GLUCOSE, CAPILLARY
Glucose-Capillary: 192 mg/dL — ABNORMAL HIGH (ref 70–99)
Glucose-Capillary: 175 mg/dL — ABNORMAL HIGH (ref 70–99)

## 2013-04-25 MED ORDER — OSELTAMIVIR PHOSPHATE 30 MG PO CAPS: 30.0000 mg | ORAL_CAPSULE | Freq: Two times a day (BID) | ORAL | Status: DC

## 2013-04-25 MED ORDER — ALBUTEROL SULFATE (5 MG/ML) 0.5% IN NEBU: 2.5000 mg | INHALATION_SOLUTION | Freq: Four times a day (QID) | RESPIRATORY_TRACT | Status: AC

## 2013-04-25 MED ORDER — PREDNISONE 10 MG PO TABS: ORAL_TABLET | ORAL | Status: DC

## 2013-04-25 MED ORDER — LEVOFLOXACIN 750 MG PO TABS: 750.0000 mg | ORAL_TABLET | ORAL | Status: DC

## 2013-04-25 NOTE — Progress Notes (Signed)
Did not perform Chest Vest, Patient being discharged and ready to go home.

## 2013-04-25 NOTE — Progress Notes (Addendum)
PATIENT DETAILS Name: Casey Whitney Age: 79 y.o. Sex: male Date of Birth: [birth year] Admit Date: 04/20/2013 Admitting Physician Joseph Art, DO ZOX:[id number], Doris Cheadle, MD  Subjective: Breathing much better.  Assessment/Plan: Principal Problem:    CAP (community acquired pneumonia) - Afebrile since admission - Continue with Rocephin and Zithromax-stop date 12/24 - Improved clinically since admission - HIV negative, urine strep pneumonia and Legionella antigen negative.  - Blood cultures 12/18 no growth  Influenza - Influenza PCR positive for influenza A.- On Tamiflu  Acute Respiratory Failure-Hypoxic -secondary to above -hospital course complicated by resp failure, bronchospasm -start on scheduled nebs,prednisone,  incentive spirometry, flutter valve  ?CVA During the hospital course, patient was noted to have a slight left facial droop, ?minimal slurring of speech. Neuro was consulted. CT head was negative, this was followed by a MRI Brain which did not show a acute CVA. -Per family, this is baseline of the patient, no further work up pursued.  Chronic kidney disease stage III - Creatinine close to usual baseline. - Monitor electrolytes periodically.  Hypertension - BP stable, continue with amlodipine. - benazepril on hold was BP Soft. BP controlled with just amlodipine.  Diabetes - CBGs stable. - Continue with SSI, hold metformin.  Dyslipidemia - Continue with statin  Dementia with acute delirium - Pleasantly confused. - Continue with Aricept and Namenda.  Disposition: Remain inpatient- home with HHPT when appropriate.   Addendum: Patient her daughter at bedside, she thinks her father is significantly better and she and her family can manage to continue further care in the outpatient setting. She wants to take him home today. Patient was ambulated at bedside, oxygen saturation RN was persistently above 90%. He has been off oxygen today. He still has  some mild rhonchi, but is moving air and is significantly better compared to the past few days. We'll discharge patient home, patient's daughter knows that in case (unlikely) patient deteriorates she will bring the patient back to the hospital.  DVT Prophylaxis: Prophylactic Heparin  Code Status: DNR  Family Communication 2 daughter's-multiple times over the phone.  MEDICATIONS: Scheduled Meds: . albuterol  2.5 mg Nebulization Q6H  . amLODipine  5 mg Oral QPM  . aspirin  325 mg Oral Daily  . azithromycin  500 mg Oral q1800  . cefTRIAXone (ROCEPHIN)  IV  1 g Intravenous Q24H  . donepezil  10 mg Oral QHS  . ferrous sulfate  325 mg Oral BID  . heparin  5,000 Units Subcutaneous Q8H  . insulin aspart  0-15 Units Subcutaneous TID WC  . insulin aspart  0-5 Units Subcutaneous QHS  . Memantine HCl ER  28 mg Oral Daily  . oseltamivir  30 mg Oral BID  . oxybutynin  10 mg Oral Daily  . predniSONE  40 mg Oral Q breakfast  . simvastatin  20 mg Oral q1800  . temazepam  15 mg Oral QHS   Continuous Infusions:   PRN Meds:.acetaminophen, acetaminophen, morphine injection, ondansetron (ZOFRAN) IV, ondansetron  Antibiotics: Anti-infectives   Start     Dose/Rate Route Frequency Ordered Stop   04/21/13 2200  oseltamivir (TAMIFLU) capsule 30 mg     30 mg Oral 2 times daily 04/21/13 1620 04/26/13 0959   04/21/13 1800  azithromycin (ZITHROMAX) tablet 500 mg     500 mg Oral Daily-1800 04/21/13 1340 04/27/13 1759   04/21/13 1000  oseltamivir (TAMIFLU) capsule 75 mg  Status:  Discontinued     75 mg Oral 2 times daily 04/21/13  1610 04/21/13 1620   04/20/13 1745  cefTRIAXone (ROCEPHIN) 1 g in dextrose 5 % 50 mL IVPB     1 g 100 mL/hr over 30 Minutes Intravenous Every 24 hours 04/20/13 1736 04/27/13 1744   04/20/13 1745  azithromycin (ZITHROMAX) 500 mg in dextrose 5 % 250 mL IVPB  Status:  Discontinued     500 mg 250 mL/hr over 60 Minutes Intravenous Every 24 hours 04/20/13 1736 04/21/13 1339    04/20/13 1245  levofloxacin (LEVAQUIN) IVPB 750 mg     750 mg 100 mL/hr over 90 Minutes Intravenous  Once 04/20/13 1240 04/20/13 1532       PHYSICAL EXAM: Vital signs in last 24 hours: Filed Vitals:   04/24/13 2041 04/25/13 0145 04/25/13 0457 04/25/13 0948  BP: 132/73  131/74   Pulse: 70  69 62  Temp: 97.6 F (36.4 C)  97.7 F (36.5 C)   TempSrc: Oral  Oral   Resp: 22  20   Height:      Weight:      SpO2: 90% 97% 91% 90%    Weight change:  Filed Weights   04/20/13 1638  Weight: 68.629 kg (151 lb 4.8 oz)   Body mass index is 29.55 kg/(m^2).   Gen Exam:Pleasantly confused. Awake, alert, NAD Neck: Supple, No JVD.   Chest:  Good air entry-still with scattered rhonchi-but much better than before. CVS: S1 S2 Regular, no murmurs.  Abdomen: soft, BS +, non tender, non distended.  Extremities: no edema, lower extremities warm to touch. Neurologic: Non Focal.    Intake/Output from previous day:  Intake/Output Summary (Last 24 hours) at 04/25/13 1329 Last data filed at 04/24/13 1953  Gross per 24 hour  Intake    290 ml  Output    500 ml  Net   -210 ml     LAB RESULTS: CBC  Recent Labs Lab 04/20/13 1220 04/20/13 1856 04/21/13 0630 04/25/13 0453  WBC 8.7 8.0 7.6 6.0  HGB 12.2* 10.6* 11.5* 11.1*  HCT 36.7* 30.5* 34.5* 32.7*  PLT 169 155 163 186  MCV 91.3 89.4 90.8 88.9  MCH 30.3 31.1 30.3 30.2  MCHC 33.2 34.8 33.3 33.9  RDW 14.4 14.7 14.7 14.5  LYMPHSABS 1.2  --   --   --   MONOABS 0.5  --   --   --   EOSABS 0.0  --   --   --   BASOSABS 0.0  --   --   --     Chemistries   Recent Labs Lab 04/20/13 1220 04/20/13 1856 04/21/13 0630 04/22/13 0600 04/25/13 0453  NA 133*  --  134* 137 139  K 4.4  --  4.0 3.8 4.7  CL 96  --  101 104 102  CO2 24  --  23 22 21   GLUCOSE 273*  --  108* 102* 222*  BUN 24*  --  19 19 24*  CREATININE 1.50* 1.40* 1.53* 1.53* 1.24  CALCIUM 9.6  --  8.7 8.1* 9.0    CBG:  Recent Labs Lab 04/24/13 1148 04/24/13 1816  04/24/13 2148 04/25/13 0808 04/25/13 1214  GLUCAP 165* 158* 188* 192* 175*     Cardiac Enzymes  Recent Labs Lab 04/20/13 1220  TROPONINI <0.30     Recent Labs  04/24/13 0625  CHOL 95  HDL 22*  LDLCALC 47  TRIG 960  CHOLHDL 4.3    MICROBIOLOGY: Recent Results (from the past 240 hour(s))  CULTURE, BLOOD (ROUTINE X 2)  Status: None   Collection Time    04/20/13  1:40 PM      Result Value Range Status   Specimen Description BLOOD LEFT HAND   Final   Special Requests BOTTLES DRAWN AEROBIC AND ANAEROBIC 5CC BOTH   Final   Culture  Setup Time     Final   Value: 04/20/2013 20:44     Performed at [facility]   Culture     Final   Value:        BLOOD CULTURE RECEIVED NO GROWTH TO DATE CULTURE WILL BE HELD FOR 5 DAYS BEFORE ISSUING A FINAL NEGATIVE REPORT     Performed at [facility]   Report Status PENDING   Incomplete  CULTURE, BLOOD (ROUTINE X 2)     Status: None   Collection Time    04/20/13  1:55 PM      Result Value Range Status   Specimen Description BLOOD RESISTANT WRIST   Final   Special Requests BOTTLES DRAWN AEROBIC AND ANAEROBIC 5CC BOTH   Final   Culture  Setup Time     Final   Value: 04/20/2013 20:44     Performed at [facility]   Culture     Final   Value:        BLOOD CULTURE RECEIVED NO GROWTH TO DATE CULTURE WILL BE HELD FOR 5 DAYS BEFORE ISSUING A FINAL NEGATIVE REPORT     Performed at [facility]   Report Status PENDING   Incomplete  CULTURE, BLOOD (ROUTINE X 2)     Status: None   Collection Time    04/20/13  6:56 PM      Result Value Range Status   Specimen Description BLOOD ARM LEFT   Final   Special Requests BOTTLES DRAWN AEROBIC ONLY 3CC   Final   Culture  Setup Time     Final   Value: 04/21/2013 02:08     Performed at [facility]   Culture     Final   Value:        BLOOD CULTURE RECEIVED NO GROWTH TO DATE CULTURE WILL BE HELD FOR 5 DAYS BEFORE ISSUING A FINAL NEGATIVE REPORT     Performed  at [facility]   Report Status PENDING   Incomplete  CULTURE, BLOOD (ROUTINE X 2)     Status: None   Collection Time    04/20/13  7:03 PM      Result Value Range Status   Specimen Description BLOOD ARM LEFT   Final   Special Requests BOTTLES DRAWN AEROBIC ONLY 5CC   Final   Culture  Setup Time     Final   Value: 04/21/2013 02:08     Performed at [facility]   Culture     Final   Value:        BLOOD CULTURE RECEIVED NO GROWTH TO DATE CULTURE WILL BE HELD FOR 5 DAYS BEFORE ISSUING A FINAL NEGATIVE REPORT     Performed at [facility]   Report Status PENDING   Incomplete  GRAM STAIN     Status: None   Collection Time    04/21/13  4:04 AM      Result Value Range Status   Specimen Description URINE, RANDOM   Final   Special Requests NONE   Final   Gram Stain     Final   Value: CYTOSPIN SLIDE     WBC PRESENT,BOTH PMN AND MONONUCLEAR  GRAM POSITIVE RODS   Report Status 04/21/2013 FINAL   Final    RADIOLOGY STUDIES/RESULTS: Dg Chest 2 View  04/20/2013   CLINICAL DATA:  Cough and chest congestion.  EXAM: CHEST  2 VIEW  COMPARISON:  07/27/2012  FINDINGS: Central peribronchial thickening again seen bilaterally. There is mild asymmetric airspace disease in the central right upper lobe, suspicious for pneumonia. No other sites of pulmonary airspace disease or consolidation noted. No evidence of pleural effusion. Heart size is stable and within normal limits.  IMPRESSION: New airspace disease in central right upper lobe, suspicious for pneumonia. Recommend short-term radiographic followup in several weeks to confirm resolution.   Electronically Signed   By: Myles Rosenthal M.D.   On: 04/20/2013 13:21    Evaristo Bury [facility] Pager:[phone]  If 7PM-7AM, please contact night-coverage [email or web address] Password [email or web address] 04/25/2013, 1:29 PM   LOS: 5 days
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
## 2013-04-25 NOTE — Progress Notes (Addendum)
Nsg Discharge Note  Admit Date:  04/20/2013 Discharge date: 04/25/2013   Rinaldo Ratel to be D/C'd Home per MD order.  AVS completed.  Copy for chart, and copy for patient signed, and dated. Patient/caregiver able to verbalize understanding.  Discharge Medication:   Medication List    STOP taking these medications       benazepril 20 MG tablet  Commonly known as:  LOTENSIN      TAKE these medications       albuterol (5 MG/ML) 0.5% nebulizer solution  Commonly known as:  PROVENTIL  Take 0.5 mLs (2.5 mg total) by nebulization every 6 (six) hours.     amLODipine 5 MG tablet  Commonly known as:  NORVASC  Take 5 mg by mouth every evening.     aspirin 325 MG tablet  Take 1 tablet (325 mg total) by mouth daily.     donepezil 10 MG tablet  Commonly known as:  ARICEPT  Take 10 mg by mouth at bedtime.     ferrous sulfate 325 (65 FE) MG tablet  Take 325 mg by mouth 2 (two) times daily.     fish oil-omega-3 fatty acids 1000 MG capsule  Take 1 g by mouth 2 (two) times daily.     levofloxacin 750 MG tablet  Commonly known as:  LEVAQUIN  Take 1 tablet (750 mg total) by mouth every other day. Take for 1 dose and then stop     menthol-zinc oxide powder  Apply 1 application topically every 7 (seven) days. As needed for foot fungus     metFORMIN 500 MG 24 hr tablet  Commonly known as:  GLUCOPHAGE-XR  Take 1,000 mg by mouth daily with breakfast.     NAMENDA XR 28 MG Cp24  Generic drug:  Memantine HCl ER  Take 28 mg by mouth every morning.     oseltamivir 30 MG capsule  Commonly known as:  TAMIFLU  Take 1 capsule (30 mg total) by mouth 2 (two) times daily.     oxybutynin 10 MG 24 hr tablet  Commonly known as:  DITROPAN-XL  Take 10 mg by mouth daily.     pravastatin 40 MG tablet  Commonly known as:  PRAVACHOL  Take 40 mg by mouth daily.     predniSONE 10 MG tablet  Commonly known as:  DELTASONE  - Take 3 tablets (30 mg) daily for 2 days, then,  - Take 2 tablets  (20 mg) daily for 2 days, then,  - Take 1 tablet (10) daily for 1 day and then stop     temazepam 15 MG capsule  Commonly known as:  RESTORIL  Take 15 mg by mouth at bedtime.        Discharge Assessment: Filed Vitals:   04/25/13 1456  BP:   Pulse: 78  Temp:   Resp: 18   Skin clean, dry and intact.I did not notice a stage one. Buttocks reddened and blanchable. Daughter instructed to keep pressure off of it., no evidence of skin tears noted. IV catheter discontinued intact. Site without signs and symptoms of complications - no redness or edema noted at insertion site, patient denies c/o pain - only slight tenderness at site.  Dressing with slight pressure applied.  D/c Instructions-Education: Discharge instructions given to patient/family with verbalized understanding. D/c education completed with patient/family including follow up instructions, medication list, d/c activities limitations if indicated, with other d/c instructions as indicated by MD - patient able to verbalize understanding, all questions fully  answered. Pt. Will not be going home with oxygen. O2 sats at rest were 97% Room air. Standing up with walker/exertion, pulse ox was 93%-94% RA. Patient instructed to return to ED, call 911, or call MD for any changes in condition.  Patient escorted via WC, and D/C home via private auto.  Kern Reap, RN 04/25/2013 4:17 PM

## 2013-04-26 LAB — CULTURE, BLOOD (ROUTINE X 2)

## 2013-04-27 LAB — CULTURE, BLOOD (ROUTINE X 2): Culture: NO GROWTH

## 2013-06-21 ENCOUNTER — Encounter (HOSPITAL_BASED_OUTPATIENT_CLINIC_OR_DEPARTMENT_OTHER): Payer: Self-pay | Admitting: Emergency Medicine

## 2013-06-21 ENCOUNTER — Emergency Department (HOSPITAL_BASED_OUTPATIENT_CLINIC_OR_DEPARTMENT_OTHER): Payer: Medicare Other

## 2013-06-21 ENCOUNTER — Inpatient Hospital Stay (HOSPITAL_BASED_OUTPATIENT_CLINIC_OR_DEPARTMENT_OTHER)
Admission: EM | Admit: 2013-06-21 | Discharge: 2013-06-23 | DRG: 194 | Disposition: A | Discharge status: Home Health | Payer: Medicare Other | Attending: Internal Medicine | Admitting: Internal Medicine

## 2013-06-21 DIAGNOSIS — R5383 Other fatigue: Secondary | ICD-10-CM

## 2013-06-21 DIAGNOSIS — Z66 Do not resuscitate: Secondary | ICD-10-CM | POA: Diagnosis present

## 2013-06-21 DIAGNOSIS — I5032 Chronic diastolic (congestive) heart failure: Secondary | ICD-10-CM | POA: Diagnosis present

## 2013-06-21 DIAGNOSIS — Z79899 Other long term (current) drug therapy: Secondary | ICD-10-CM

## 2013-06-21 DIAGNOSIS — Z7982 Long term (current) use of aspirin: Secondary | ICD-10-CM

## 2013-06-21 DIAGNOSIS — N400 Enlarged prostate without lower urinary tract symptoms: Secondary | ICD-10-CM | POA: Diagnosis present

## 2013-06-21 DIAGNOSIS — F0390 Unspecified dementia without behavioral disturbance: Secondary | ICD-10-CM | POA: Diagnosis present

## 2013-06-21 DIAGNOSIS — E785 Hyperlipidemia, unspecified: Secondary | ICD-10-CM | POA: Diagnosis present

## 2013-06-21 DIAGNOSIS — I509 Heart failure, unspecified: Secondary | ICD-10-CM | POA: Diagnosis present

## 2013-06-21 DIAGNOSIS — E119 Type 2 diabetes mellitus without complications: Secondary | ICD-10-CM | POA: Diagnosis present

## 2013-06-21 DIAGNOSIS — I10 Essential (primary) hypertension: Secondary | ICD-10-CM | POA: Diagnosis present

## 2013-06-21 DIAGNOSIS — R5381 Other malaise: Secondary | ICD-10-CM | POA: Diagnosis present

## 2013-06-21 DIAGNOSIS — N179 Acute kidney failure, unspecified: Secondary | ICD-10-CM | POA: Diagnosis present

## 2013-06-21 DIAGNOSIS — R0902 Hypoxemia: Secondary | ICD-10-CM

## 2013-06-21 DIAGNOSIS — J189 Pneumonia, unspecified organism: Principal | ICD-10-CM | POA: Diagnosis present

## 2013-06-21 DIAGNOSIS — R531 Weakness: Secondary | ICD-10-CM | POA: Diagnosis present

## 2013-06-21 DIAGNOSIS — Z8673 Personal history of transient ischemic attack (TIA), and cerebral infarction without residual deficits: Secondary | ICD-10-CM

## 2013-06-21 DIAGNOSIS — E860 Dehydration: Secondary | ICD-10-CM | POA: Diagnosis present

## 2013-06-21 LAB — CBC WITH DIFFERENTIAL/PLATELET
BASOS PCT: 0 % (ref 0–1)
Basophils Absolute: 0 10*3/uL (ref 0.0–0.1)
EOS PCT: 3 % (ref 0–5)
Eosinophils Absolute: 0.2 10*3/uL (ref 0.0–0.7)
HEMATOCRIT: 38.1 % — AB (ref 39.0–52.0)
HEMOGLOBIN: 12.6 g/dL — AB (ref 13.0–17.0)
Lymphocytes Relative: 20 % (ref 12–46)
Lymphs Abs: 1.5 10*3/uL (ref 0.7–4.0)
MCH: 30.1 pg (ref 26.0–34.0)
MCHC: 33.1 g/dL (ref 30.0–36.0)
MCV: 90.9 fL (ref 78.0–100.0)
MONO ABS: 0.5 10*3/uL (ref 0.1–1.0)
Monocytes Relative: 7 % (ref 3–12)
Neutro Abs: 5.4 10*3/uL (ref 1.7–7.7)
Neutrophils Relative %: 70 % (ref 43–77)
Platelets: 199 10*3/uL (ref 150–400)
RBC: 4.19 MIL/uL — ABNORMAL LOW (ref 4.22–5.81)
RDW: 14.7 % (ref 11.5–15.5)
WBC: 7.7 10*3/uL (ref 4.0–10.5)

## 2013-06-21 LAB — POCT I-STAT 3, VENOUS BLOOD GAS (G3P V)
ACID-BASE EXCESS: 5 mmol/L — AB (ref 0.0–2.0)
Bicarbonate: 31 mEq/L — ABNORMAL HIGH (ref 20.0–24.0)
O2 SAT: 68 %
TCO2: 32 mmol/L (ref 0–100)
pCO2, Ven: 49.8 mmHg (ref 45.0–50.0)
pH, Ven: 7.401 — ABNORMAL HIGH (ref 7.250–7.300)
pO2, Ven: 36 mmHg (ref 30.0–45.0)

## 2013-06-21 LAB — URINALYSIS, ROUTINE W REFLEX MICROSCOPIC
Bilirubin Urine: NEGATIVE
Glucose, UA: 500 mg/dL — AB
Hgb urine dipstick: NEGATIVE
KETONES UR: NEGATIVE mg/dL
LEUKOCYTES UA: NEGATIVE
Nitrite: NEGATIVE
PROTEIN: NEGATIVE mg/dL
Specific Gravity, Urine: 1.012 (ref 1.005–1.030)
UROBILINOGEN UA: 0.2 mg/dL (ref 0.0–1.0)
pH: 6.5 (ref 5.0–8.0)

## 2013-06-21 LAB — COMPREHENSIVE METABOLIC PANEL
ALT: 13 U/L (ref 0–53)
AST: 15 U/L (ref 0–37)
Albumin: 3.9 g/dL (ref 3.5–5.2)
Alkaline Phosphatase: 66 U/L (ref 39–117)
BILIRUBIN TOTAL: 0.4 mg/dL (ref 0.3–1.2)
BUN: 30 mg/dL — ABNORMAL HIGH (ref 6–23)
CALCIUM: 9.8 mg/dL (ref 8.4–10.5)
CHLORIDE: 94 meq/L — AB (ref 96–112)
CO2: 30 mEq/L (ref 19–32)
CREATININE: 1.7 mg/dL — AB (ref 0.50–1.35)
GFR calc Af Amer: 39 mL/min — ABNORMAL LOW (ref >90)
GFR, EST NON AFRICAN AMERICAN: 34 mL/min — AB (ref >90)
Glucose, Bld: 381 mg/dL — ABNORMAL HIGH (ref 70–99)
Potassium: 3.6 mEq/L — ABNORMAL LOW (ref 3.7–5.3)
Sodium: 138 mEq/L (ref 137–147)
Total Protein: 7.4 g/dL (ref 6.0–8.3)

## 2013-06-21 LAB — GLUCOSE, CAPILLARY
GLUCOSE-CAPILLARY: 379 mg/dL — AB (ref 70–99)
Glucose-Capillary: 160 mg/dL — ABNORMAL HIGH (ref 70–99)
Glucose-Capillary: 100 mg/dL — ABNORMAL HIGH (ref 70–99)

## 2013-06-21 LAB — LIPASE, BLOOD: Lipase: 31 U/L (ref 11–59)

## 2013-06-21 LAB — CG4 I-STAT (LACTIC ACID): Lactic Acid, Venous: 1.96 mmol/L (ref 0.5–2.2)

## 2013-06-21 LAB — KETONES, QUALITATIVE: Acetone, Bld: NEGATIVE

## 2013-06-21 MED ORDER — METFORMIN HCL ER 500 MG PO TB24: 1000.0000 mg | ORAL_TABLET | Freq: Every day | ORAL | Status: DC

## 2013-06-21 MED ORDER — DONEPEZIL HCL 10 MG PO TABS
10.0000 mg | ORAL_TABLET | Freq: Every day | ORAL | Status: DC
  Administered 2013-06-21 – 2013-06-22 (×2): 10 mg via ORAL
  Filled 2013-06-21 (×3): qty 1

## 2013-06-21 MED ORDER — AMLODIPINE BESYLATE 5 MG PO TABS
5.0000 mg | ORAL_TABLET | Freq: Every evening | ORAL | Status: DC
  Administered 2013-06-21 – 2013-06-22 (×2): 5 mg via ORAL
  Filled 2013-06-21 (×3): qty 1

## 2013-06-21 MED ORDER — DEXTROSE 5 % IV SOLN
1.0000 g | INTRAVENOUS | Status: DC
  Administered 2013-06-22 (×2): 1 g via INTRAVENOUS
  Filled 2013-06-21 (×3): qty 1

## 2013-06-21 MED ORDER — NYSTATIN 100000 UNIT/GM EX POWD
CUTANEOUS | Status: AC
  Filled 2013-06-21: qty 15

## 2013-06-21 MED ORDER — NYSTATIN 100000 UNIT/GM EX POWD
Freq: Once | CUTANEOUS | Status: AC
  Administered 2013-06-21: 16:00:00 via TOPICAL

## 2013-06-21 MED ORDER — INSULIN ASPART 100 UNIT/ML ~~LOC~~ SOLN
0.0000 [IU] | Freq: Three times a day (TID) | SUBCUTANEOUS | Status: DC
  Administered 2013-06-22 (×2): 2 [IU] via SUBCUTANEOUS
  Administered 2013-06-23: 3 [IU] via SUBCUTANEOUS
  Administered 2013-06-23: 2 [IU] via SUBCUTANEOUS

## 2013-06-21 MED ORDER — TEMAZEPAM 15 MG PO CAPS
30.0000 mg | ORAL_CAPSULE | Freq: Every day | ORAL | Status: DC
  Administered 2013-06-21 – 2013-06-22 (×2): 30 mg via ORAL
  Filled 2013-06-21 (×2): qty 2

## 2013-06-21 MED ORDER — FERROUS SULFATE 325 (65 FE) MG PO TABS
325.0000 mg | ORAL_TABLET | Freq: Two times a day (BID) | ORAL | Status: DC
  Administered 2013-06-21 – 2013-06-23 (×4): 325 mg via ORAL
  Filled 2013-06-21 (×5): qty 1

## 2013-06-21 MED ORDER — OXYBUTYNIN CHLORIDE ER 10 MG PO TB24
10.0000 mg | ORAL_TABLET | Freq: Every day | ORAL | Status: DC
  Administered 2013-06-22 – 2013-06-23 (×2): 10 mg via ORAL
  Filled 2013-06-21 (×2): qty 1

## 2013-06-21 MED ORDER — LEVOFLOXACIN IN D5W 500 MG/100ML IV SOLN
500.0000 mg | INTRAVENOUS | Status: DC
  Administered 2013-06-21: 500 mg via INTRAVENOUS
  Filled 2013-06-21: qty 100

## 2013-06-21 MED ORDER — ASPIRIN 325 MG PO TABS
325.0000 mg | ORAL_TABLET | Freq: Every day | ORAL | Status: DC
  Administered 2013-06-21 – 2013-06-23 (×3): 325 mg via ORAL
  Filled 2013-06-21 (×4): qty 1

## 2013-06-21 MED ORDER — OMEGA-3 FATTY ACIDS 1000 MG PO CAPS: 1.0000 g | ORAL_CAPSULE | Freq: Two times a day (BID) | ORAL | Status: DC

## 2013-06-21 MED ORDER — MEMANTINE HCL ER 28 MG PO CP24
28.0000 mg | ORAL_CAPSULE | Freq: Every morning | ORAL | Status: DC
  Administered 2013-06-22 – 2013-06-23 (×2): 28 mg via ORAL
  Filled 2013-06-21 (×2): qty 28

## 2013-06-21 MED ORDER — SODIUM CHLORIDE 0.9 % IV SOLN: INTRAVENOUS | Status: DC

## 2013-06-21 MED ORDER — ALBUTEROL SULFATE (5 MG/ML) 0.5% IN NEBU: 2.5000 mg | INHALATION_SOLUTION | Freq: Four times a day (QID) | RESPIRATORY_TRACT | Status: DC

## 2013-06-21 MED ORDER — ALBUTEROL SULFATE (2.5 MG/3ML) 0.083% IN NEBU: 2.5000 mg | INHALATION_SOLUTION | Freq: Four times a day (QID) | RESPIRATORY_TRACT | Status: DC | PRN

## 2013-06-21 MED ORDER — DEXTROSE 5 % IV SOLN
1.0000 g | Freq: Three times a day (TID) | INTRAVENOUS | Status: DC
  Filled 2013-06-21 (×2): qty 1

## 2013-06-21 MED ORDER — SODIUM CHLORIDE 0.9 % IV SOLN
Freq: Once | INTRAVENOUS | Status: AC
  Administered 2013-06-21: 16:00:00 via INTRAVENOUS

## 2013-06-21 MED ORDER — OMEGA-3-ACID ETHYL ESTERS 1 G PO CAPS
1.0000 g | ORAL_CAPSULE | Freq: Two times a day (BID) | ORAL | Status: DC
  Administered 2013-06-21 – 2013-06-23 (×4): 1 g via ORAL
  Filled 2013-06-21 (×5): qty 1

## 2013-06-21 MED ORDER — SODIUM CHLORIDE 0.9 % IV BOLUS (SEPSIS)
1000.0000 mL | Freq: Once | INTRAVENOUS | Status: AC
  Administered 2013-06-21: 1000 mL via INTRAVENOUS

## 2013-06-21 MED ORDER — SIMVASTATIN 20 MG PO TABS
20.0000 mg | ORAL_TABLET | Freq: Every day | ORAL | Status: DC
  Administered 2013-06-22: 20 mg via ORAL
  Filled 2013-06-21 (×2): qty 1

## 2013-06-21 MED ORDER — HEPARIN SODIUM (PORCINE) 5000 UNIT/ML IJ SOLN
5000.0000 [IU] | Freq: Three times a day (TID) | INTRAMUSCULAR | Status: DC
  Administered 2013-06-21 – 2013-06-23 (×6): 5000 [IU] via SUBCUTANEOUS
  Filled 2013-06-21 (×8): qty 1

## 2013-06-21 MED ORDER — VANCOMYCIN HCL IN DEXTROSE 1-5 GM/200ML-% IV SOLN
1000.0000 mg | INTRAVENOUS | Status: DC
  Administered 2013-06-21 – 2013-06-22 (×2): 1000 mg via INTRAVENOUS
  Filled 2013-06-21 (×3): qty 200
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
## 2013-06-21 NOTE — ED Notes (Signed)
Allevyn Gentle Border Silicone Gel Adhesive Hydrocellular foam dressing applied to sacrum on suspected stage 1 pressure ulcer.  Area present on arrival.
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
## 2013-06-21 NOTE — ED Notes (Addendum)
Pt brought via family members for high blood sugar and Sa02 89% at home. Pt lives with daughter, denies pain, reports feeling tired. Daughter sts pt had "fluid in lungs last week".

## 2013-06-21 NOTE — Progress Notes (Signed)
80 yo coming in with Weakness, dehydration=CBG's elevated, mild renal failure-CXR/CT Chest showing a right upper lobe infiltrate. Patient had PNA in December with right upper infiltrate as well, so may not have cleared since then. Stable vitals-accepted to med surg.

## 2013-06-22 DIAGNOSIS — I5032 Chronic diastolic (congestive) heart failure: Secondary | ICD-10-CM | POA: Diagnosis present

## 2013-06-22 LAB — CBC
HCT: 37.5 % — ABNORMAL LOW (ref 39.0–52.0)
Hemoglobin: 12.4 g/dL — ABNORMAL LOW (ref 13.0–17.0)
MCH: 29.5 pg (ref 26.0–34.0)
MCHC: 33.1 g/dL (ref 30.0–36.0)
MCV: 89.3 fL (ref 78.0–100.0)
PLATELETS: 187 10*3/uL (ref 150–400)
RBC: 4.2 MIL/uL — ABNORMAL LOW (ref 4.22–5.81)
RDW: 14.7 % (ref 11.5–15.5)
WBC: 7.4 10*3/uL (ref 4.0–10.5)

## 2013-06-22 LAB — HIV ANTIBODY (ROUTINE TESTING W REFLEX): HIV: NONREACTIVE

## 2013-06-22 LAB — STREP PNEUMONIAE URINARY ANTIGEN: Strep Pneumo Urinary Antigen: NEGATIVE

## 2013-06-22 LAB — GLUCOSE, CAPILLARY
GLUCOSE-CAPILLARY: 85 mg/dL (ref 70–99)
Glucose-Capillary: 130 mg/dL — ABNORMAL HIGH (ref 70–99)
Glucose-Capillary: 124 mg/dL — ABNORMAL HIGH (ref 70–99)
Glucose-Capillary: 163 mg/dL — ABNORMAL HIGH (ref 70–99)

## 2013-06-22 LAB — BASIC METABOLIC PANEL
BUN: 24 mg/dL — ABNORMAL HIGH (ref 6–23)
CALCIUM: 9.3 mg/dL (ref 8.4–10.5)
CO2: 31 mEq/L (ref 19–32)
CREATININE: 1.48 mg/dL — AB (ref 0.50–1.35)
Chloride: 97 mEq/L (ref 96–112)
GFR, EST AFRICAN AMERICAN: 47 mL/min — AB (ref >90)
GFR, EST NON AFRICAN AMERICAN: 40 mL/min — AB (ref >90)
Glucose, Bld: 129 mg/dL — ABNORMAL HIGH (ref 70–99)
Potassium: 3.3 mEq/L — ABNORMAL LOW (ref 3.7–5.3)
SODIUM: 141 meq/L (ref 137–147)

## 2013-06-22 LAB — INFLUENZA PANEL BY PCR (TYPE A & B)
H1N1 flu by pcr: NOT DETECTED
INFLAPCR: NEGATIVE
INFLBPCR: NEGATIVE

## 2013-06-22 MED ORDER — SODIUM CHLORIDE 0.9 % IV SOLN
INTRAVENOUS | Status: DC
  Administered 2013-06-22: 21:00:00 via INTRAVENOUS
  Administered 2013-06-22: 700 mL via INTRAVENOUS

## 2013-06-22 NOTE — Progress Notes (Signed)
[facility] PROGRESS NOTE  Casey Whitney JHE:[id number] DOB: 03/06/1922 DOA: 06/21/2013 PCP: Abigail Miyamoto, MD  Assessment/Plan: Principal Problem:   HCAP (healthcare-associated pneumonia): Improved, stable. Continue antibiotics and change over to by mouth tomorrow Active Problems:   DM (diabetes mellitus) colon with treatment of infection, CBG is much improved today    HTN (hypertension): Stable.    Generalized weakness: Secondary pneumonia. Will start ambulating with assistance.    Acute renal failure: Better, creatinine not yet at baseline, continue gentle hydration    Dehydration: Secondary to pneumonia.    Chronic diastolic heart failure: By echocardiogram last year, grade 1 diastolic dysfunction. Taper down IV fluids. Check BNP in the morning  Code Status: DO NOT RESUSCITATE Family Communication: Left message with daughter Disposition Plan: Home hopefully in the next few days   Consultants:  None  Procedures:  None  Antibiotics:  IV cefepime and vancomycin day 2  HPI/Subjective: Patient doing okay. Breathing much better than yesterday. No complaints.  Objective: Filed Vitals:   06/22/13 1344  BP: 121/80  Pulse: 68  Temp: 97.9 F (36.6 C)  Resp: 16    Intake/Output Summary (Last 24 hours) at 06/22/13 1521 Last data filed at 06/22/13 1300  Gross per 24 hour  Intake    360 ml  Output    250 ml  Net    110 ml   Filed Weights   06/21/13 2030  Weight: 62.5 kg (137 lb 12.6 oz)    Exam:   General:  Alert and oriented x3, no acute distress  Cardiovascular: Regular rate and rhythm, S1-S2  Respiratory: Clear to auscultation bilaterally  Abdomen: Soft, nontender, nondistended, positive bowel sounds  Musculoskeletal: No clubbing cyanosis or edema   Data Reviewed: Basic Metabolic Panel:  Recent Labs Lab 06/21/13 1150 06/22/13 0534  NA 138 141  K 3.6* 3.3*  CL 94* 97  CO2 30 31  GLUCOSE 381* 129*  BUN 30* 24*  CREATININE 1.70*  1.48*  CALCIUM 9.8 9.3   Liver Function Tests:  Recent Labs Lab 06/21/13 1150  AST 15  ALT 13  ALKPHOS 66  BILITOT 0.4  PROT 7.4  ALBUMIN 3.9    Recent Labs Lab 06/21/13 1150  LIPASE 31   No results found for this basename: AMMONIA,  in the last 168 hours CBC:  Recent Labs Lab 06/21/13 1150 06/22/13 0534  WBC 7.7 7.4  NEUTROABS 5.4  --   HGB 12.6* 12.4*  HCT 38.1* 37.5*  MCV 90.9 89.3  PLT 199 187   Cardiac Enzymes: No results found for this basename: CKTOTAL, CKMB, CKMBINDEX, TROPONINI,  in the last 168 hours BNP (last 3 results) No results found for this basename: PROBNP,  in the last 8760 hours CBG:  Recent Labs Lab 06/21/13 1124 06/21/13 1551 06/21/13 1823 06/22/13 0751 06/22/13 1158  GLUCAP 379* 160* 100* 130* 124*    No results found for this or any previous visit (from the past 240 hour(s)).   Studies: Dg Chest 2 View  06/21/2013   CLINICAL DATA:  Former smoker with cough and generalized weakness.  EXAM: CHEST  2 VIEW  COMPARISON:  DG CHEST 2V dated 05/16/2013; DG CHEST 2 VIEW dated 04/24/2013; DG CHEST 2 VIEW dated 04/20/2013; DG CHEST 2 VIEW dated 07/27/2012  FINDINGS: Persistent patchy airspace opacities in the right upper lobe, not significantly changed since the examinations 1 month ago and 2 months ago. New linear atelectasis in the right middle lobe. Dense approximate 1 cm nodule in the  medial right lower lobe, unchanged dating back to the 07/2012 exam. Subcentimeter dense nodule in the right upper lobe, also unchanged. Mildly prominent bronchovascular markings diffusely and moderate central peribronchial thickening, unchanged. No new pulmonary parenchymal abnormalities. No visible pleural effusions.  Cardiac silhouette upper normal in size, unchanged. Hilar and mediastinal contours unremarkable for age. Degenerative changes involving the thoracic and upper lumbar spine with mild thoracic scoliosis convex right, unchanged.  IMPRESSION: 1. Persistent  right upper lobe pneumonia dating back to the examination 2 months ago. As this has not significantly changed in the interval, a centrally obstructing lesion should be excluded. Therefore, CT of the chest, with contrast if possible, may be helpful in further evaluation. 2. New linear atelectasis in the right middle lobe. 3. Stable baseline changes of chronic bronchitis and/or asthma. 4. Stable calcified granulomata in the right lung.   Electronically Signed   By: Evangeline Dakin M.D.   On: 06/21/2013 12:24   Ct Chest Wo Contrast  06/21/2013   CLINICAL DATA:  Cough and weakness with history of tobacco use and diabetes.  EXAM: CT CHEST WITHOUT CONTRAST  TECHNIQUE: Multidetector CT imaging of the chest was performed following the standard protocol without IV contrast.  COMPARISON:  DG CHEST 2 VIEW dated 06/21/2013  FINDINGS: At lung window settings there is an interstitial type infiltrate posteriorly in the right upper lobe. There is a calcified 7 mm diameter nodule in the anterior aspect of the right upper lobe. There are other calcified nodules elsewhere in both lungs. There is compressive atelectasis versus minimal interstitial infiltrate in the posterior aspects of both lower lobes. There is mild bronchiectasis posteriorly in the lower lobes. There are emphysematous changes bilaterally.  The cardiac chambers are enlarged. There is no pleural nor pericardial effusion. There are coronary artery calcifications. There are small calcified bilateral hilar lymph nodes. There is a calcified lymph node in the precarinal region to the right of midline which measures 13 x 14 mm.  Within the upper abdomen there are calcifications within the liver and spleen consistent with previous granulomatous infection. There are calcified stones and sludge within the visualized portions of the gallbladder. The thoracic vertebral bodies are preserved in height. The sternum and visualized portions of the ribs exhibit no acute  abnormalities. The thoracic esophagus is normal in caliber.  IMPRESSION: 1. There is patchy interstitial infiltrate posteriorly in the right upper lobe. Less conspicuous but similar appearing findings are noted in the posterior aspects of the lower lobes bilaterally. This is superimposed upon findings of emphysema as well as bibasilar bronchiectasis. 2. There is evidence of previous granulomatous infection of the lungs as well as of the liver and spleen. 3. There is enlargement of the cardiac chambers without evidence of pulmonary edema. 4. There are gallstones present.   Electronically Signed   By: David  Martinique   On: 06/21/2013 15:27    Scheduled Meds: . amLODipine  5 mg Oral QPM  . aspirin  325 mg Oral Daily  . ceFEPime (MAXIPIME) IV  1 g Intravenous Q24H  . donepezil  10 mg Oral QHS  . ferrous sulfate  325 mg Oral BID  . heparin  5,000 Units Subcutaneous 3 times per day  . insulin aspart  0-15 Units Subcutaneous TID WC  . Memantine HCl ER  28 mg Oral q morning - 10a  . omega-3 acid ethyl esters  1 g Oral BID  . oxybutynin  10 mg Oral Daily  . simvastatin  20 mg Oral q1800  .  temazepam  30 mg Oral QHS  . vancomycin  1,000 mg Intravenous Q24H   Continuous Infusions: . sodium chloride      Principal Problem:   HCAP (healthcare-associated pneumonia) Active Problems:   DM (diabetes mellitus)   HTN (hypertension)   Generalized weakness   Acute renal failure   Dehydration   Chronic diastolic heart failure    Time spent: 25 minutes    [facility] Pager [phone]. If 7PM-7AM, please contact night-coverage at [email or web address], password [email or web address] 06/22/2013, 3:21 PM  LOS: 1 day

## 2013-06-22 NOTE — Progress Notes (Signed)
UR completed. Yazlynn Birkeland RN CCM Case Mgmt phone [phone] 

## 2013-06-23 DIAGNOSIS — R5383 Other fatigue: Secondary | ICD-10-CM

## 2013-06-23 DIAGNOSIS — R5381 Other malaise: Secondary | ICD-10-CM

## 2013-06-23 DIAGNOSIS — I5032 Chronic diastolic (congestive) heart failure: Secondary | ICD-10-CM

## 2013-06-23 LAB — PRO B NATRIURETIC PEPTIDE: Pro B Natriuretic peptide (BNP): 251 pg/mL (ref 0–450)

## 2013-06-23 LAB — GLUCOSE, CAPILLARY
GLUCOSE-CAPILLARY: 125 mg/dL — AB (ref 70–99)
GLUCOSE-CAPILLARY: 180 mg/dL — AB (ref 70–99)

## 2013-06-23 LAB — BASIC METABOLIC PANEL
BUN: 21 mg/dL (ref 6–23)
CO2: 25 meq/L (ref 19–32)
Calcium: 9.3 mg/dL (ref 8.4–10.5)
Chloride: 98 mEq/L (ref 96–112)
Creatinine, Ser: 1.35 mg/dL (ref 0.50–1.35)
GFR calc Af Amer: 52 mL/min — ABNORMAL LOW (ref >90)
GFR calc non Af Amer: 45 mL/min — ABNORMAL LOW (ref >90)
GLUCOSE: 130 mg/dL — AB (ref 70–99)
POTASSIUM: 4.8 meq/L (ref 3.7–5.3)
SODIUM: 139 meq/L (ref 137–147)

## 2013-06-23 MED ORDER — LEVOFLOXACIN 250 MG PO TABS: 250.0000 mg | ORAL_TABLET | Freq: Every day | ORAL | Status: AC

## 2013-06-23 NOTE — Progress Notes (Signed)
NURSING PROGRESS NOTE  Casey Whitney [id number] Discharge Data: 06/23/2013 4:37 PM Attending Provider: Annita Brod, MD [id number], Jaymes Graff, MD     [name] to be D/C'd Home per MD order.  Discussed with the patient and family  the After Visit Summary and all questions fully answered. All IV's discontinued with no bleeding noted. All belongings returned to patient for patient to take home.   Last Vital Signs:  Blood pressure 118/75, pulse 83, temperature 97.7 F (36.5 C), temperature source Oral, resp. rate 16, height 5\' 6"  (1.676 m), weight 62.5 kg (137 lb 12.6 oz), SpO2 92.00%.  Discharge Medication List   Medication List         albuterol (5 MG/ML) 0.5% nebulizer solution  Commonly known as:  PROVENTIL  Take 0.5 mLs (2.5 mg total) by nebulization every 6 (six) hours.     amLODipine 5 MG tablet  Commonly known as:  NORVASC  Take 5 mg by mouth every evening.     aspirin 325 MG tablet  Take 1 tablet (325 mg total) by mouth daily.     donepezil 10 MG tablet  Commonly known as:  ARICEPT  Take 10 mg by mouth at bedtime.     ferrous sulfate 325 (65 FE) MG tablet  Take 325 mg by mouth 2 (two) times daily.     fish oil-omega-3 fatty acids 1000 MG capsule  Take 1 g by mouth 2 (two) times daily.     furosemide 40 MG tablet  Commonly known as:  LASIX  Take 40 mg by mouth daily.     levofloxacin 250 MG tablet  Commonly known as:  LEVAQUIN  Take 1 tablet (250 mg total) by mouth daily.     menthol-zinc oxide powder  Apply 1 application topically every 7 (seven) days. As needed for foot fungus     metFORMIN 500 MG 24 hr tablet  Commonly known as:  GLUCOPHAGE-XR  Take 1,000 mg by mouth daily with breakfast.     NAMENDA XR 28 MG Cp24  Generic drug:  Memantine HCl ER  Take 28 mg by mouth every morning.     oxybutynin 10 MG 24 hr tablet  Commonly known as:  DITROPAN-XL  Take 10 mg by mouth daily.     pravastatin 40 MG tablet  Commonly known as:  PRAVACHOL   Take 40 mg by mouth daily.     temazepam 15 MG capsule  Commonly known as:  RESTORIL  Take 30 mg by mouth at bedtime.

## 2013-06-23 NOTE — Evaluation (Signed)
Physical Therapy Evaluation Patient Details Name: Casey Whitney MRN: [id number] DOB: June 21, 1921 Today's Date: 06/23/2013 Time: 4166-0630 PT Time Calculation (min): 27 min  PT Assessment / Plan / Recommendation History of Present Illness  With a hx of dementia, hld, htn, prior cva, and dm who presented to [facility] with cough, sob, and marked weakness that started 3-4 days prior to admit. He was then brought to the ED where a cxr demonstrated a new R sided PNA. No leukocytosis . Pt with flu  Clinical Impression  Patient presents likely close to functional baseline.  Difficulty using our walker here due to has swivel wheels on his walker at home.  Noted shuffling gait and increased proximity to walker as well as incorrect hand placement for sit<>stand so feel will benefit from skilled PT in the acute setting as well as HHPT for safety education reinforcement to reduce fall risk.    PT Assessment  Patient needs continued PT services    Follow Up Recommendations  Home health PT;Supervision/Assistance - 24 hour (or as close to 24 hours as possible)    Does the patient have the potential to tolerate intense rehabilitation    N/A  Barriers to Discharge Decreased caregiver support 2 hour period patien left unattended    Equipment Recommendations  None recommended by PT    Recommendations for Other Services   None  Frequency Min 3X/week    Precautions / Restrictions Precautions Precautions: Fall Restrictions Weight Bearing Restrictions: No   Pertinent Vitals/Pain No pain complaints; HR 87, SpO2 93% ambulating on room air      Mobility  Bed Mobility General bed mobility comments: seen after OT who assisted up to edge of bed Transfers Overall transfer level: Needs assistance Equipment used: Rolling walker (2 wheeled) Transfers: Sit to/from Stand Sit to Stand: Supervision General transfer comment: for safety due to pulls up on walker and to sit holds walker despite cues to reach for  armrests Ambulation/Gait Ambulation/Gait assistance: Supervision;Min assist Ambulation Distance (Feet): 160 Feet Assistive device: Rolling walker (2 wheeled) Gait Pattern/deviations: Step-through pattern;Trunk flexed;Shuffle;Decreased stride length General Gait Details: cues for safety/proximity to walker, supervision for straight path, min assist to turn due to uses 4-W walker at home with swivel wheels and difficulty turning walker    Exercises     PT Diagnosis: Abnormality of gait  PT Problem List: Decreased balance;Decreased knowledge of use of DME;Decreased safety awareness;Decreased knowledge of precautions PT Treatment Interventions: DME instruction;Therapeutic activities;Patient/family education;Functional mobility training;Balance training;Gait training;Stair training     PT Goals(Current goals can be found in the care plan section) Acute Rehab PT Goals Patient Stated Goal: none stated PT Goal Formulation: With patient Time For Goal Achievement: 07/07/13 Potential to Achieve Goals: Fair  Visit Information  Last PT Received On: 06/23/13 Assistance Needed: +1 History of Present Illness: With a hx of dementia, hld, htn, prior cva, and dm who presented to [facility] with cough, sob, and marked weakness that started 3-4 days prior to admit. He was then brought to the ED where a cxr demonstrated a new R sided PNA. No leukocytosis . Pt with flu       Prior [place] expects to be discharged to:: Private residence Living Arrangements: Spouse/significant other;Children Available Help at Discharge: Family;Available PRN/intermittently Type of Home: House Home Access: Stairs to enter [employer] of Steps: 4 Entrance Stairs-Rails: Left Home Layout: One level Home Equipment: Walker - 4 wheels;Shower seat - built in Additional Comments: Has 2 daughters that intermittently come  over to help throughout the day.  There is a couple hour gap throughout  the day without a daughter there to help Prior Function Level of Independence: Independent with assistive device(s) Comments: says he uses 4-wheeled walker Communication Communication: HOH Dominant Hand: Right    Cognition  Cognition Arousal/Alertness: Awake/alert Behavior During Therapy: WFL for tasks assessed/performed Overall Cognitive Status: History of cognitive impairments - at baseline Memory: Decreased short-term memory    Extremity/Trunk Assessment Upper Extremity Assessment Upper Extremity Assessment: Defer to OT evaluation Lower Extremity Assessment Lower Extremity Assessment: Overall WFL for tasks assessed Cervical / Trunk Assessment Cervical / Trunk Assessment: Kyphotic   Balance Balance Overall balance assessment: Needs assistance Standing balance-Leahy Scale: Poor Standing balance comment: relies on UE assist for balance  End of Session PT - End of Session Equipment Utilized During Treatment: Gait belt;Oxygen Activity Tolerance: Patient tolerated treatment well Patient left: in chair;with call bell/phone within reach;with chair alarm set Nurse Communication: Other (comment) (incontinent of stool with ambulation, O2 sats on room air)  GP     [facility] 06/23/2013, 10:35 AM Magda Kiel, [place] 06/23/2013

## 2013-06-23 NOTE — Progress Notes (Signed)
Occupational Therapy Evaluation Patient Details Name: Gillie Crisci MRN: [id number] DOB: 1921-12-16 Today's Date: 06/23/2013 Time: 0865-7846 OT Time Calculation (min): 28 min  OT Assessment / Plan / Recommendation History of present illness With a hx of dementia, hld, htn, prior cva, and dm who presented to [facility] with cough, sob, and marked weakness that started 3-4 days prior to admit. He was then brought to the ED where a cxr demonstrated a new R sided PNA. No leukocytosis . Pt with flu   Clinical Impression   Patient presents to OT with decreased ADL independence and safety due to generalized weakness, decreased mobility. Will benefit from OT to maximize independence.    OT Assessment  Patient needs continued OT Services    Follow Up Recommendations  Home health OT;Supervision/Assistance - 24 hour    Barriers to Discharge      Equipment Recommendations  3 in 1 bedside comode    Recommendations for Other Services    Frequency  Min 2X/week    Precautions / Restrictions Precautions Precautions: Fall Restrictions Weight Bearing Restrictions: No   Pertinent Vitals/Pain No c/o pain    ADL  Eating/Feeding: Performed;Set up Where Assessed - Eating/Feeding: Bed level Grooming: Performed;Wash/dry face;Teeth care;Set up Where Assessed - Grooming: Unsupported sitting Upper Body Bathing: Simulated;Minimal assistance Where Assessed - Upper Body Bathing: Unsupported sitting Lower Body Bathing: Simulated;Moderate assistance Where Assessed - Lower Body Bathing: Unsupported sitting Upper Body Dressing: Performed;Minimal assistance (due to lines/leads) Where Assessed - Upper Body Dressing: Unsupported sitting Lower Body Dressing: Performed;Maximal assistance Where Assessed - Lower Body Dressing: Supported sitting Toilet Transfer: Simulated;Min guard Toilet Transfer Method: Sit to stand;Stand pivot Science writer: Bedside commode Transfers/Ambulation Related to ADLs: Bed  mobility S and increased time. Min guard A sit to stand and transfers, ambulation with RW. Cues for safety. ADL Comments: Difficulty donning socks, getting down to feet.    OT Diagnosis: Generalized weakness  OT Problem List: Decreased activity tolerance;Decreased safety awareness OT Treatment Interventions: Self-care/ADL training;DME and/or AE instruction;Therapeutic exercise;Therapeutic activities;Patient/family education   OT Goals(Current goals can be found in the care plan section) Acute Rehab OT Goals Patient Stated Goal: none stated OT Goal Formulation: With patient Time For Goal Achievement: 07/07/13 Potential to Achieve Goals: Good  Visit Information  Last OT Received On: 06/23/13 Assistance Needed: +1 History of Present Illness: With a hx of dementia, hld, htn, prior cva, and dm who presented to [facility] with cough, sob, and marked weakness that started 3-4 days prior to admit. He was then brought to the ED where a cxr demonstrated a new R sided PNA. No leukocytosis . Pt with flu       Prior [place] expects to be discharged to:: Private residence Living Arrangements: Spouse/significant other;Children Available Help at Discharge: Family;Available PRN/intermittently Type of Home: House Home Access: Stairs to enter [employer] of Steps: 4 Entrance Stairs-Rails: Left Home Layout: One level Home Equipment: Walker - 4 wheels;Shower seat - built in Prior Function Level of Independence: Independent with assistive device(s) Comments: says he uses Social research officer, government Communication: HOH Dominant Hand: Right         Vision/Perception Vision - History Baseline Vision: Wears glasses all the time Patient Visual Report: No change from baseline   Cognition  Cognition Arousal/Alertness: Awake/alert Behavior During Therapy: WFL for tasks assessed/performed Overall Cognitive Status: History of cognitive impairments - at  baseline Memory: Decreased short-term memory    Extremity/Trunk Assessment Upper Extremity Assessment Upper Extremity  Assessment: Overall WFL for tasks assessed Lower Extremity Assessment Lower Extremity Assessment: Defer to PT evaluation     End of Session OT - End of Session Equipment Utilized During Treatment: Gait belt;Rolling walker Activity Tolerance: Patient tolerated treatment well Patient left: Other (comment) (in care of physical therapist) Nurse Communication: Mobility status  GO     Vida Nicol A 06/23/2013, 10:09 AM

## 2013-06-23 NOTE — Care Management Note (Signed)
    Page 1 of 2   06/23/2013     3:28:39 PM   CARE MANAGEMENT NOTE 06/23/2013  Patient:  BURNIE, THERIEN   Account Number:  [id number]  Date Initiated:  06/23/2013  Documentation initiated by:  Tomi Bamberger  Subjective/Objective Assessment:   dx pna  admit- lives with daughter.     Action/Plan:   pt eval- rec hhpt/ot   Anticipated DC Date:  06/23/2013   Anticipated DC Plan:  [place]  CM consult      [place]   Choice offered to / List presented to:  C-1 Patient   DME arranged  [place]      DME agency  [place] arranged  [place].   Status of service:  Completed, signed off Medicare Important Message given?   (If response is "NO", the following Medicare IM given date fields will be blank) Date Medicare IM given:   Date Additional Medicare IM given:    Discharge Disposition:  [place]  Per UR Regulation:  Reviewed for med. necessity/level of care/duration of stay  If discussed at [place] of Stay Meetings, dates discussed:    Comments:  06/23/13 [place], BSN [phone] patient lives with daughter, per physical therapy recs hhpt/ot, patient chose [facility] from agency list, referral made to [facility] , Butch Penny notified for hhpt/ot.  Soc will begin 24-48 hrs post discharge. Justin with [facility] notified for3 n 1.

## 2013-06-23 NOTE — Discharge Summary (Signed)
Physician Discharge Summary  Casey Whitney [id number] DOB: 07-Sep-1921 DOA: 06/21/2013  PCP: Casey Miyamoto, MD  Admit date: 06/21/2013 Discharge date: 06/23/2013  Time spent: 25 minutes  Recommendations for Outpatient Follow-up:  1. Home health PT 2. Patient will follow up with his primary care physician next 2-3 weeks  Discharge Diagnoses:  Principal Problem:   HCAP (healthcare-associated pneumonia) Active Problems:   DM (diabetes mellitus)   HTN (hypertension)   Generalized weakness   Acute renal failure   Dehydration   Chronic diastolic heart failure   Discharge Condition: Improved, being discharged home  Diet recommendation: Heart healthy carb modified  Filed Weights   06/21/13 2030  Weight: 62.5 kg (137 lb 12.6 oz)    History of present illness:  The patient is an 80 year old white male who had a recent history of right upper lobe pneumonia complicated with flu approximately 2 months ago who presented to emergency room with 1-2 day history of shortness of breath and cough and elevated blood sugars. Reportedly, oxygen saturations at home are in the 80s. Chest x-ray noted a right upper lobe pneumonia. Patient was placed on the healthcare associated pneumonia protocol treated with cefepime and vancomycin and admitted to the hospitalist service.  Hospital Course:  Principal Problem:   HCAP (healthcare-associated pneumonia): Patient was started on IV antibiotics and by 2/20 oxygen saturations are stable and he was breathing comfortably. His changed over to by mouth Levaquin of which she will be given prescription for 5 more days to complete a total of 7 days of therapy  Active Problems:   DM (diabetes mellitus): During hospitalization, metformin put on hold. Patient is on sliding scale which he tolerated well. Blood sugars improved and patient resume metformin upon discharge    HTN (hypertension): Stable. Initially patient's medications were put on hold and then  restarted upon discharge.    Generalized weakness: Patient evaluated by physical therapy recommended home health PT this has been set up and they will follow patient at home. Next line  Acute renal failure: On admission, patient's BUN was elevated at 30 the creatinine 1.7, patient was gently hydrated. By day of discharge, patient's BUN was down to 21 creatinine 1.35, felt to be close to patient's baseline.    Dehydration: Secondary to pneumonia. Gently rehydrated.    Chronic diastolic heart failure: BNP checked on day of discharge and found to be normal at 251.   Procedures:  None  Consultations:  None  Discharge Exam: Filed Vitals:   06/23/13 1504  BP: 118/75  Pulse: 83  Temp: 97.7 F (36.5 C)  Resp: 16    General: Alert and oriented x3, no acute distress Cardiovascular: Regular rate and rhythm, S1-S2 Respiratory: Clear to auscultation bilaterally  Discharge Instructions  Discharge Orders   Future Orders Complete By Expires   Diet - low sodium heart healthy  As directed    Increase activity slowly  As directed        Medication List         albuterol (5 MG/ML) 0.5% nebulizer solution  Commonly known as:  PROVENTIL  Take 0.5 mLs (2.5 mg total) by nebulization every 6 (six) hours.     amLODipine 5 MG tablet  Commonly known as:  NORVASC  Take 5 mg by mouth every evening.     aspirin 325 MG tablet  Take 1 tablet (325 mg total) by mouth daily.     donepezil 10 MG tablet  Commonly known as:  ARICEPT  Take 10 mg  by mouth at bedtime.     ferrous sulfate 325 (65 FE) MG tablet  Take 325 mg by mouth 2 (two) times daily.     fish oil-omega-3 fatty acids 1000 MG capsule  Take 1 g by mouth 2 (two) times daily.     furosemide 40 MG tablet  Commonly known as:  LASIX  Take 40 mg by mouth daily.     levofloxacin 250 MG tablet  Commonly known as:  LEVAQUIN  Take 1 tablet (250 mg total) by mouth daily.     menthol-zinc oxide powder  Apply 1 application topically  every 7 (seven) days. As needed for foot fungus     metFORMIN 500 MG 24 hr tablet  Commonly known as:  GLUCOPHAGE-XR  Take 1,000 mg by mouth daily with breakfast.     NAMENDA XR 28 MG Cp24  Generic drug:  Memantine HCl ER  Take 28 mg by mouth every morning.     oxybutynin 10 MG 24 hr tablet  Commonly known as:  DITROPAN-XL  Take 10 mg by mouth daily.     pravastatin 40 MG tablet  Commonly known as:  PRAVACHOL  Take 40 mg by mouth daily.     temazepam 15 MG capsule  Commonly known as:  RESTORIL  Take 30 mg by mouth at bedtime.       No Known Allergies     Follow-up Information   Follow up with Casey Miyamoto, MD. Call on 07/06/2013. (hosptial Follow up at 3pm)    Specialty:  Family Medicine   Contact information:   [place] [id number] [phone]        The results of significant diagnostics from this hospitalization (including imaging, microbiology, ancillary and laboratory) are listed below for reference.    Significant Diagnostic Studies: Dg Chest 2 View  06/21/2013   CLINICAL DATA:  Former smoker with cough and generalized weakness.  EXAM: CHEST  2 VIEW  COMPARISON:  DG CHEST 2V dated 05/16/2013; DG CHEST 2 VIEW dated 04/24/2013; DG CHEST 2 VIEW dated 04/20/2013; DG CHEST 2 VIEW dated 07/27/2012  FINDINGS: Persistent patchy airspace opacities in the right upper lobe, not significantly changed since the examinations 1 month ago and 2 months ago. New linear atelectasis in the right middle lobe. Dense approximate 1 cm nodule in the medial right lower lobe, unchanged dating back to the 07/2012 exam. Subcentimeter dense nodule in the right upper lobe, also unchanged. Mildly prominent bronchovascular markings diffusely and moderate central peribronchial thickening, unchanged. No new pulmonary parenchymal abnormalities. No visible pleural effusions.  Cardiac silhouette upper normal in size, unchanged. Hilar and mediastinal contours unremarkable for age. Degenerative  changes involving the thoracic and upper lumbar spine with mild thoracic scoliosis convex right, unchanged.  IMPRESSION: 1. Persistent right upper lobe pneumonia dating back to the examination 2 months ago. As this has not significantly changed in the interval, a centrally obstructing lesion should be excluded. Therefore, CT of the chest, with contrast if possible, may be helpful in further evaluation. 2. New linear atelectasis in the right middle lobe. 3. Stable baseline changes of chronic bronchitis and/or asthma. 4. Stable calcified granulomata in the right lung.   Electronically Signed   By: Evangeline Dakin M.D.   On: 06/21/2013 12:24   Ct Chest Wo Contrast  06/21/2013   CLINICAL DATA:  Cough and weakness with history of tobacco use and diabetes.  EXAM: CT CHEST WITHOUT CONTRAST  TECHNIQUE: Multidetector CT imaging of  the chest was performed following the standard protocol without IV contrast.  COMPARISON:  DG CHEST 2 VIEW dated 06/21/2013  FINDINGS: At lung window settings there is an interstitial type infiltrate posteriorly in the right upper lobe. There is a calcified 7 mm diameter nodule in the anterior aspect of the right upper lobe. There are other calcified nodules elsewhere in both lungs. There is compressive atelectasis versus minimal interstitial infiltrate in the posterior aspects of both lower lobes. There is mild bronchiectasis posteriorly in the lower lobes. There are emphysematous changes bilaterally.  The cardiac chambers are enlarged. There is no pleural nor pericardial effusion. There are coronary artery calcifications. There are small calcified bilateral hilar lymph nodes. There is a calcified lymph node in the precarinal region to the right of midline which measures 13 x 14 mm.  Within the upper abdomen there are calcifications within the liver and spleen consistent with previous granulomatous infection. There are calcified stones and sludge within the visualized portions of the  gallbladder. The thoracic vertebral bodies are preserved in height. The sternum and visualized portions of the ribs exhibit no acute abnormalities. The thoracic esophagus is normal in caliber.  IMPRESSION: 1. There is patchy interstitial infiltrate posteriorly in the right upper lobe. Less conspicuous but similar appearing findings are noted in the posterior aspects of the lower lobes bilaterally. This is superimposed upon findings of emphysema as well as bibasilar bronchiectasis. 2. There is evidence of previous granulomatous infection of the lungs as well as of the liver and spleen. 3. There is enlargement of the cardiac chambers without evidence of pulmonary edema. 4. There are gallstones present.   Electronically Signed   By: David  Martinique   On: 06/21/2013 15:27    Microbiology: Recent Results (from the past 240 hour(s))  CULTURE, BLOOD (ROUTINE X 2)     Status: None   Collection Time    06/21/13 10:27 PM      Result Value Ref Range Status   Specimen Description BLOOD RIGHT HAND   Final   Special Requests BOTTLES DRAWN AEROBIC ONLY 10CC   Final   Culture  Setup Time     Final   Value: 06/22/2013 04:23     Performed at [facility]   Culture     Final   Value:        BLOOD CULTURE RECEIVED NO GROWTH TO DATE CULTURE WILL BE HELD FOR 5 DAYS BEFORE ISSUING A FINAL NEGATIVE REPORT     Performed at [facility]   Report Status PENDING   Incomplete  CULTURE, BLOOD (ROUTINE X 2)     Status: None   Collection Time    06/21/13 10:35 PM      Result Value Ref Range Status   Specimen Description BLOOD LEFT ARM   Final   Special Requests BOTTLES DRAWN AEROBIC ONLY [facility]   Final   Culture  Setup Time     Final   Value: 06/22/2013 04:23     Performed at [facility]   Culture     Final   Value:        BLOOD CULTURE RECEIVED NO GROWTH TO DATE CULTURE WILL BE HELD FOR 5 DAYS BEFORE ISSUING A FINAL NEGATIVE REPORT     Performed at [facility]   Report Status PENDING    Incomplete     Labs: Basic Metabolic Panel:  Recent Labs Lab 06/21/13 1150 06/22/13 0534 06/23/13 0439  NA 138 141 139  K  3.6* 3.3* 4.8  CL 94* 97 98  CO2 30 31 25   GLUCOSE 381* 129* 130*  BUN 30* 24* 21  CREATININE 1.70* 1.48* 1.35  CALCIUM 9.8 9.3 9.3   Liver Function Tests:  Recent Labs Lab 06/21/13 1150  AST 15  ALT 13  ALKPHOS 66  BILITOT 0.4  PROT 7.4  ALBUMIN 3.9    Recent Labs Lab 06/21/13 1150  LIPASE 31   No results found for this basename: AMMONIA,  in the last 168 hours CBC:  Recent Labs Lab 06/21/13 1150 06/22/13 0534  WBC 7.7 7.4  NEUTROABS 5.4  --   HGB 12.6* 12.4*  HCT 38.1* 37.5*  MCV 90.9 89.3  PLT 199 187   Cardiac Enzymes: No results found for this basename: CKTOTAL, CKMB, CKMBINDEX, TROPONINI,  in the last 168 hours BNP: BNP (last 3 results)  Recent Labs  06/23/13 0439  PROBNP 251.0   CBG:  Recent Labs Lab 06/22/13 1158 06/22/13 1735 06/22/13 2115 06/23/13 0815 06/23/13 1231  GLUCAP 124* 85 163* 125* 180*       Signed:  Bernadetta Roell K  [facility] 06/23/2013, 6:08 PM

## 2013-06-28 LAB — CULTURE, BLOOD (ROUTINE X 2)
CULTURE: NO GROWTH
Culture: NO GROWTH

## 2013-12-18 ENCOUNTER — Other Ambulatory Visit: Payer: Self-pay | Admitting: Internal Medicine

## 2013-12-19 ENCOUNTER — Emergency Department (HOSPITAL_BASED_OUTPATIENT_CLINIC_OR_DEPARTMENT_OTHER): Payer: Medicare Other

## 2013-12-19 ENCOUNTER — Emergency Department (HOSPITAL_BASED_OUTPATIENT_CLINIC_OR_DEPARTMENT_OTHER)
Admission: EM | Admit: 2013-12-19 | Discharge: 2013-12-19 | Disposition: A | Discharge status: Home / Self Care | Payer: Medicare Other | Attending: Emergency Medicine | Admitting: Emergency Medicine

## 2013-12-19 ENCOUNTER — Encounter (HOSPITAL_BASED_OUTPATIENT_CLINIC_OR_DEPARTMENT_OTHER): Payer: Self-pay | Admitting: Emergency Medicine

## 2013-12-19 DIAGNOSIS — E785 Hyperlipidemia, unspecified: Secondary | ICD-10-CM | POA: Insufficient documentation

## 2013-12-19 DIAGNOSIS — F0390 Unspecified dementia without behavioral disturbance: Secondary | ICD-10-CM | POA: Insufficient documentation

## 2013-12-19 DIAGNOSIS — S022XXB Fracture of nasal bones, initial encounter for open fracture: Secondary | ICD-10-CM | POA: Insufficient documentation

## 2013-12-19 DIAGNOSIS — J209 Acute bronchitis, unspecified: Secondary | ICD-10-CM | POA: Diagnosis not present

## 2013-12-19 DIAGNOSIS — Z8673 Personal history of transient ischemic attack (TIA), and cerebral infarction without residual deficits: Secondary | ICD-10-CM | POA: Diagnosis not present

## 2013-12-19 DIAGNOSIS — E119 Type 2 diabetes mellitus without complications: Secondary | ICD-10-CM | POA: Insufficient documentation

## 2013-12-19 DIAGNOSIS — Z8701 Personal history of pneumonia (recurrent): Secondary | ICD-10-CM | POA: Diagnosis not present

## 2013-12-19 DIAGNOSIS — Z8546 Personal history of malignant neoplasm of prostate: Secondary | ICD-10-CM | POA: Insufficient documentation

## 2013-12-19 DIAGNOSIS — S0120XA Unspecified open wound of nose, initial encounter: Secondary | ICD-10-CM | POA: Diagnosis not present

## 2013-12-19 DIAGNOSIS — R296 Repeated falls: Secondary | ICD-10-CM | POA: Diagnosis not present

## 2013-12-19 DIAGNOSIS — Z7982 Long term (current) use of aspirin: Secondary | ICD-10-CM | POA: Insufficient documentation

## 2013-12-19 DIAGNOSIS — Z79899 Other long term (current) drug therapy: Secondary | ICD-10-CM | POA: Diagnosis not present

## 2013-12-19 DIAGNOSIS — I10 Essential (primary) hypertension: Secondary | ICD-10-CM | POA: Diagnosis not present

## 2013-12-19 DIAGNOSIS — Z792 Long term (current) use of antibiotics: Secondary | ICD-10-CM | POA: Diagnosis not present

## 2013-12-19 DIAGNOSIS — S0990XA Unspecified injury of head, initial encounter: Secondary | ICD-10-CM | POA: Diagnosis present

## 2013-12-19 DIAGNOSIS — Y9289 Other specified places as the place of occurrence of the external cause: Secondary | ICD-10-CM | POA: Insufficient documentation

## 2013-12-19 DIAGNOSIS — Y9389 Activity, other specified: Secondary | ICD-10-CM | POA: Insufficient documentation

## 2013-12-19 DIAGNOSIS — Z8719 Personal history of other diseases of the digestive system: Secondary | ICD-10-CM | POA: Insufficient documentation

## 2013-12-19 DIAGNOSIS — Z87448 Personal history of other diseases of urinary system: Secondary | ICD-10-CM | POA: Diagnosis not present

## 2013-12-19 DIAGNOSIS — J40 Bronchitis, not specified as acute or chronic: Secondary | ICD-10-CM

## 2013-12-19 DIAGNOSIS — W19XXXA Unspecified fall, initial encounter: Secondary | ICD-10-CM

## 2013-12-19 LAB — CBC WITH DIFFERENTIAL/PLATELET
BASOS ABS: 0 10*3/uL (ref 0.0–0.1)
Basophils Relative: 0 % (ref 0–1)
Eosinophils Absolute: 0.3 10*3/uL (ref 0.0–0.7)
Eosinophils Relative: 4 % (ref 0–5)
HEMATOCRIT: 35.4 % — AB (ref 39.0–52.0)
Hemoglobin: 11.8 g/dL — ABNORMAL LOW (ref 13.0–17.0)
LYMPHS PCT: 24 % (ref 12–46)
Lymphs Abs: 2 10*3/uL (ref 0.7–4.0)
MCH: 29.5 pg (ref 26.0–34.0)
MCHC: 33.3 g/dL (ref 30.0–36.0)
MCV: 88.5 fL (ref 78.0–100.0)
MONO ABS: 0.6 10*3/uL (ref 0.1–1.0)
Monocytes Relative: 7 % (ref 3–12)
NEUTROS ABS: 5.6 10*3/uL (ref 1.7–7.7)
Neutrophils Relative %: 66 % (ref 43–77)
PLATELETS: 241 10*3/uL (ref 150–400)
RBC: 4 MIL/uL — ABNORMAL LOW (ref 4.22–5.81)
RDW: 15.2 % (ref 11.5–15.5)
WBC: 8.5 10*3/uL (ref 4.0–10.5)

## 2013-12-19 LAB — BASIC METABOLIC PANEL
ANION GAP: 13 (ref 5–15)
BUN: 21 mg/dL (ref 6–23)
CHLORIDE: 101 meq/L (ref 96–112)
CO2: 26 meq/L (ref 19–32)
Calcium: 10.1 mg/dL (ref 8.4–10.5)
Creatinine, Ser: 1.5 mg/dL — ABNORMAL HIGH (ref 0.50–1.35)
GFR calc Af Amer: 45 mL/min — ABNORMAL LOW (ref >90)
GFR calc non Af Amer: 39 mL/min — ABNORMAL LOW (ref >90)
Glucose, Bld: 174 mg/dL — ABNORMAL HIGH (ref 70–99)
Potassium: 4.8 mEq/L (ref 3.7–5.3)
SODIUM: 140 meq/L (ref 137–147)

## 2013-12-19 LAB — PRO B NATRIURETIC PEPTIDE: Pro B Natriuretic peptide (BNP): 348.6 pg/mL (ref 0–450)

## 2013-12-19 MED ORDER — AZITHROMYCIN 250 MG PO TABS: 250.0000 mg | ORAL_TABLET | Freq: Every day | ORAL | Status: AC

## 2013-12-19 NOTE — ED Provider Notes (Signed)
LACERATION REPAIR Performed by: Michele Mcalpine, Adelene Idler, [place] student Authorized by: Michele Mcalpine Consent: Verbal consent obtained. Risks and benefits: risks, benefits and alternatives were discussed Consent given by: patient Patient identity confirmed: provided demographic data Prepped and Draped in normal sterile fashion Wound explored  Laceration Location: nose  Laceration Length: 2 cm  No Foreign Bodies seen or palpated  Anesthesia: local infiltration  Local anesthetic: xylocaine 2% without epinephrine  Anesthetic total: 1 ml  Irrigation method: syringe Amount of cleaning: standard  Skin closure: 5-0 prolene  Number of sutures: 5  Technique: simple interrupted  Patient tolerance: Patient tolerated the procedure well with no immediate complications.   Illene Labrador, PA-C 12/19/13 1242

## 2013-12-19 NOTE — Discharge Instructions (Signed)
Take antibiotic as directed. Make an appointment to followup with ear nose and throat. Return for any newer worse symptoms. Apply antibiotic ointment to the nose daily. Suture removal in 7 days.

## 2013-12-19 NOTE — ED Notes (Signed)
Patient transported to X-ray 
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
## 2013-12-20 NOTE — ED Provider Notes (Signed)
Medical screening examination/treatment/procedure(s) were conducted as a shared visit with non-physician practitioner(s) and myself.  I personally evaluated the patient during the encounter.   EKG Interpretation None     See my note  Fredia Sorrow, MD 12/20/13 [phone]

## 2014-04-25 ENCOUNTER — Emergency Department (HOSPITAL_COMMUNITY): Payer: Medicare Other

## 2014-04-25 ENCOUNTER — Inpatient Hospital Stay (HOSPITAL_COMMUNITY)
Admission: EM | Admit: 2014-04-25 | Discharge: 2014-05-04 | DRG: 871 | Disposition: E | Payer: Medicare Other | Attending: Internal Medicine | Admitting: Internal Medicine

## 2014-04-25 ENCOUNTER — Encounter (HOSPITAL_COMMUNITY): Payer: Self-pay | Admitting: Emergency Medicine

## 2014-04-25 DIAGNOSIS — Z515 Encounter for palliative care: Secondary | ICD-10-CM

## 2014-04-25 DIAGNOSIS — Z6821 Body mass index (BMI) 21.0-21.9, adult: Secondary | ICD-10-CM

## 2014-04-25 DIAGNOSIS — E119 Type 2 diabetes mellitus without complications: Secondary | ICD-10-CM | POA: Diagnosis present

## 2014-04-25 DIAGNOSIS — Z7951 Long term (current) use of inhaled steroids: Secondary | ICD-10-CM | POA: Diagnosis not present

## 2014-04-25 DIAGNOSIS — I5032 Chronic diastolic (congestive) heart failure: Secondary | ICD-10-CM | POA: Diagnosis present

## 2014-04-25 DIAGNOSIS — I129 Hypertensive chronic kidney disease with stage 1 through stage 4 chronic kidney disease, or unspecified chronic kidney disease: Secondary | ICD-10-CM | POA: Diagnosis present

## 2014-04-25 DIAGNOSIS — N183 Chronic kidney disease, stage 3 unspecified: Secondary | ICD-10-CM | POA: Diagnosis present

## 2014-04-25 DIAGNOSIS — B9620 Unspecified Escherichia coli [E. coli] as the cause of diseases classified elsewhere: Secondary | ICD-10-CM | POA: Diagnosis present

## 2014-04-25 DIAGNOSIS — N400 Enlarged prostate without lower urinary tract symptoms: Secondary | ICD-10-CM | POA: Diagnosis present

## 2014-04-25 DIAGNOSIS — I4891 Unspecified atrial fibrillation: Secondary | ICD-10-CM | POA: Diagnosis present

## 2014-04-25 DIAGNOSIS — J13 Pneumonia due to Streptococcus pneumoniae: Secondary | ICD-10-CM | POA: Diagnosis present

## 2014-04-25 DIAGNOSIS — G92 Toxic encephalopathy: Secondary | ICD-10-CM | POA: Diagnosis present

## 2014-04-25 DIAGNOSIS — E785 Hyperlipidemia, unspecified: Secondary | ICD-10-CM | POA: Diagnosis present

## 2014-04-25 DIAGNOSIS — E440 Moderate protein-calorie malnutrition: Secondary | ICD-10-CM | POA: Diagnosis present

## 2014-04-25 DIAGNOSIS — Z66 Do not resuscitate: Secondary | ICD-10-CM | POA: Diagnosis present

## 2014-04-25 DIAGNOSIS — J189 Pneumonia, unspecified organism: Secondary | ICD-10-CM | POA: Diagnosis present

## 2014-04-25 DIAGNOSIS — J60 Coalworker's pneumoconiosis: Secondary | ICD-10-CM | POA: Diagnosis present

## 2014-04-25 DIAGNOSIS — A419 Sepsis, unspecified organism: Principal | ICD-10-CM | POA: Diagnosis present

## 2014-04-25 DIAGNOSIS — G929 Unspecified toxic encephalopathy: Secondary | ICD-10-CM | POA: Diagnosis present

## 2014-04-25 DIAGNOSIS — F0390 Unspecified dementia without behavioral disturbance: Secondary | ICD-10-CM | POA: Diagnosis present

## 2014-04-25 DIAGNOSIS — Z8673 Personal history of transient ischemic attack (TIA), and cerebral infarction without residual deficits: Secondary | ICD-10-CM

## 2014-04-25 DIAGNOSIS — N390 Urinary tract infection, site not specified: Secondary | ICD-10-CM | POA: Diagnosis present

## 2014-04-25 DIAGNOSIS — R0902 Hypoxemia: Secondary | ICD-10-CM

## 2014-04-25 DIAGNOSIS — Z7982 Long term (current) use of aspirin: Secondary | ICD-10-CM

## 2014-04-25 DIAGNOSIS — Z8546 Personal history of malignant neoplasm of prostate: Secondary | ICD-10-CM | POA: Diagnosis not present

## 2014-04-25 DIAGNOSIS — J9601 Acute respiratory failure with hypoxia: Secondary | ICD-10-CM | POA: Diagnosis present

## 2014-04-25 DIAGNOSIS — Z22322 Carrier or suspected carrier of Methicillin resistant Staphylococcus aureus: Secondary | ICD-10-CM | POA: Diagnosis not present

## 2014-04-25 DIAGNOSIS — G4700 Insomnia, unspecified: Secondary | ICD-10-CM | POA: Diagnosis present

## 2014-04-25 LAB — DIFFERENTIAL
BASOS ABS: 0 10*3/uL (ref 0.0–0.1)
Basophils Relative: 0 % (ref 0–1)
Eosinophils Absolute: 0 10*3/uL (ref 0.0–0.7)
Eosinophils Relative: 0 % (ref 0–5)
LYMPHS ABS: 0.9 10*3/uL (ref 0.7–4.0)
Lymphocytes Relative: 11 % — ABNORMAL LOW (ref 12–46)
Monocytes Absolute: 0.2 10*3/uL (ref 0.1–1.0)
Monocytes Relative: 2 % — ABNORMAL LOW (ref 3–12)
Neutro Abs: 7.5 10*3/uL (ref 1.7–7.7)
Neutrophils Relative %: 87 % — ABNORMAL HIGH (ref 43–77)

## 2014-04-25 LAB — URINALYSIS, ROUTINE W REFLEX MICROSCOPIC
Bilirubin Urine: NEGATIVE
Glucose, UA: NEGATIVE mg/dL
Hgb urine dipstick: NEGATIVE
Ketones, ur: NEGATIVE mg/dL
NITRITE: POSITIVE — AB
PH: 5.5 (ref 5.0–8.0)
Protein, ur: 30 mg/dL — AB
Specific Gravity, Urine: 1.023 (ref 1.005–1.030)
Urobilinogen, UA: 0.2 mg/dL (ref 0.0–1.0)

## 2014-04-25 LAB — CBC
HCT: 37.5 % — ABNORMAL LOW (ref 39.0–52.0)
HCT: 34.6 % — ABNORMAL LOW (ref 39.0–52.0)
HEMOGLOBIN: 11.8 g/dL — AB (ref 13.0–17.0)
Hemoglobin: 12.8 g/dL — ABNORMAL LOW (ref 13.0–17.0)
MCH: 29.8 pg (ref 26.0–34.0)
MCH: 29.7 pg (ref 26.0–34.0)
MCHC: 34.1 g/dL (ref 30.0–36.0)
MCHC: 34.1 g/dL (ref 30.0–36.0)
MCV: 87.2 fL (ref 78.0–100.0)
MCV: 87.2 fL (ref 78.0–100.0)
Platelets: 182 10*3/uL (ref 150–400)
Platelets: 167 10*3/uL (ref 150–400)
RBC: 4.3 MIL/uL (ref 4.22–5.81)
RBC: 3.97 MIL/uL — ABNORMAL LOW (ref 4.22–5.81)
RDW: 15.7 % — ABNORMAL HIGH (ref 11.5–15.5)
RDW: 15.7 % — ABNORMAL HIGH (ref 11.5–15.5)
WBC: 8.6 10*3/uL (ref 4.0–10.5)
WBC: 6.8 10*3/uL (ref 4.0–10.5)

## 2014-04-25 LAB — BASIC METABOLIC PANEL
Anion gap: 14 (ref 5–15)
BUN: 41 mg/dL — ABNORMAL HIGH (ref 6–23)
CO2: 22 mmol/L (ref 19–32)
Calcium: 9.9 mg/dL (ref 8.4–10.5)
Chloride: 100 mEq/L (ref 96–112)
Creatinine, Ser: 1.74 mg/dL — ABNORMAL HIGH (ref 0.50–1.35)
GFR calc Af Amer: 38 mL/min — ABNORMAL LOW (ref >90)
GFR, EST NON AFRICAN AMERICAN: 33 mL/min — AB (ref >90)
Glucose, Bld: 208 mg/dL — ABNORMAL HIGH (ref 70–99)
POTASSIUM: 4.9 mmol/L (ref 3.5–5.1)
SODIUM: 136 mmol/L (ref 135–145)

## 2014-04-25 LAB — POCT I-STAT 3, ART BLOOD GAS (G3+)
Acid-base deficit: 5 mmol/L — ABNORMAL HIGH (ref 0.0–2.0)
BICARBONATE: 18.6 meq/L — AB (ref 20.0–24.0)
O2 Saturation: 86 %
PH ART: 7.38 (ref 7.350–7.450)
TCO2: 20 mmol/L (ref 0–100)
pCO2 arterial: 31.7 mmHg — ABNORMAL LOW (ref 35.0–45.0)
pO2, Arterial: 53 mmHg — ABNORMAL LOW (ref 80.0–100.0)

## 2014-04-25 LAB — STREP PNEUMONIAE URINARY ANTIGEN: Strep Pneumo Urinary Antigen: POSITIVE — AB

## 2014-04-25 LAB — GLUCOSE, CAPILLARY: Glucose-Capillary: 235 mg/dL — ABNORMAL HIGH (ref 70–99)

## 2014-04-25 LAB — URINE MICROSCOPIC-ADD ON

## 2014-04-25 LAB — BRAIN NATRIURETIC PEPTIDE: B Natriuretic Peptide: 188.8 pg/mL — ABNORMAL HIGH (ref 0.0–100.0)

## 2014-04-25 LAB — I-STAT TROPONIN, ED: TROPONIN I, POC: 0.01 ng/mL (ref 0.00–0.08)

## 2014-04-25 LAB — CREATININE, SERUM
CREATININE: 1.61 mg/dL — AB (ref 0.50–1.35)
GFR calc Af Amer: 42 mL/min — ABNORMAL LOW (ref >90)
GFR calc non Af Amer: 36 mL/min — ABNORMAL LOW (ref >90)

## 2014-04-25 LAB — MRSA PCR SCREENING: MRSA by PCR: POSITIVE — AB

## 2014-04-25 LAB — I-STAT CG4 LACTIC ACID, ED: LACTIC ACID, VENOUS: 3.76 mmol/L — AB (ref 0.5–2.2)

## 2014-04-25 MED ORDER — SODIUM CHLORIDE 0.9 % IV BOLUS (SEPSIS)
1000.0000 mL | Freq: Once | INTRAVENOUS | Status: AC
  Administered 2014-04-25: 1000 mL via INTRAVENOUS

## 2014-04-25 MED ORDER — PIPERACILLIN-TAZOBACTAM 3.375 G IVPB
3.3750 g | Freq: Three times a day (TID) | INTRAVENOUS | Status: DC
  Administered 2014-04-25 – 2014-04-28 (×8): 3.375 g via INTRAVENOUS
  Filled 2014-04-25 (×10): qty 50

## 2014-04-25 MED ORDER — OXYBUTYNIN CHLORIDE ER 10 MG PO TB24
10.0000 mg | ORAL_TABLET | Freq: Every day | ORAL | Status: DC
  Administered 2014-04-25 – 2014-04-27 (×3): 10 mg via ORAL
  Filled 2014-04-25 (×4): qty 1

## 2014-04-25 MED ORDER — VANCOMYCIN HCL IN DEXTROSE 1-5 GM/200ML-% IV SOLN
1000.0000 mg | Freq: Once | INTRAVENOUS | Status: AC
  Administered 2014-04-25: 1000 mg via INTRAVENOUS
  Filled 2014-04-25: qty 200

## 2014-04-25 MED ORDER — AMLODIPINE BESYLATE 5 MG PO TABS
5.0000 mg | ORAL_TABLET | Freq: Every evening | ORAL | Status: DC
  Administered 2014-04-25 – 2014-04-27 (×2): 5 mg via ORAL
  Filled 2014-04-25 (×4): qty 1

## 2014-04-25 MED ORDER — ACETAMINOPHEN 325 MG PO TABS
650.0000 mg | ORAL_TABLET | Freq: Once | ORAL | Status: AC
  Administered 2014-04-25: 650 mg via ORAL
  Filled 2014-04-25: qty 2

## 2014-04-25 MED ORDER — DONEPEZIL HCL 10 MG PO TABS: 10.0000 mg | ORAL_TABLET | Freq: Every day | ORAL | Status: DC

## 2014-04-25 MED ORDER — FERROUS SULFATE 325 (65 FE) MG PO TABS
325.0000 mg | ORAL_TABLET | Freq: Two times a day (BID) | ORAL | Status: DC
  Administered 2014-04-25 – 2014-04-27 (×3): 325 mg via ORAL
  Filled 2014-04-25 (×7): qty 1

## 2014-04-25 MED ORDER — GUAIFENESIN ER 600 MG PO TB12
1200.0000 mg | ORAL_TABLET | Freq: Two times a day (BID) | ORAL | Status: DC
  Administered 2014-04-25 – 2014-04-27 (×3): 1200 mg via ORAL
  Filled 2014-04-25 (×5): qty 2

## 2014-04-25 MED ORDER — PIPERACILLIN-TAZOBACTAM 3.375 G IVPB
3.3750 g | Freq: Once | INTRAVENOUS | Status: AC
  Administered 2014-04-25: 3.375 g via INTRAVENOUS
  Filled 2014-04-25: qty 50

## 2014-04-25 MED ORDER — OMEGA-3 FATTY ACIDS 1000 MG PO CAPS: 1.0000 g | ORAL_CAPSULE | Freq: Two times a day (BID) | ORAL | Status: DC

## 2014-04-25 MED ORDER — DEXTROSE 5 % IV SOLN: 1.0000 g | Freq: Once | INTRAVENOUS | Status: DC

## 2014-04-25 MED ORDER — SODIUM CHLORIDE 0.9 % IV SOLN
INTRAVENOUS | Status: DC
  Administered 2014-04-27 – 2014-04-28 (×2): via INTRAVENOUS

## 2014-04-25 MED ORDER — PRAVASTATIN SODIUM 40 MG PO TABS
40.0000 mg | ORAL_TABLET | Freq: Every day | ORAL | Status: DC
Start: 2014-04-25 — End: 2014-04-28
  Administered 2014-04-25 – 2014-04-27 (×3): 40 mg via ORAL
  Filled 2014-04-25 (×4): qty 1

## 2014-04-25 MED ORDER — ENOXAPARIN SODIUM 30 MG/0.3ML ~~LOC~~ SOLN
30.0000 mg | SUBCUTANEOUS | Status: DC
  Administered 2014-04-25 – 2014-04-27 (×2): 30 mg via SUBCUTANEOUS
  Filled 2014-04-25 (×4): qty 0.3

## 2014-04-25 MED ORDER — IPRATROPIUM-ALBUTEROL 0.5-2.5 (3) MG/3ML IN SOLN
3.0000 mL | Freq: Once | RESPIRATORY_TRACT | Status: AC
  Administered 2014-04-25: 3 mL via RESPIRATORY_TRACT
  Filled 2014-04-25: qty 3

## 2014-04-25 MED ORDER — SODIUM CHLORIDE 0.9 % IV SOLN: INTRAVENOUS | Status: AC

## 2014-04-25 MED ORDER — OMEGA-3-ACID ETHYL ESTERS 1 G PO CAPS
1.0000 g | ORAL_CAPSULE | Freq: Two times a day (BID) | ORAL | Status: DC
  Administered 2014-04-25 – 2014-04-27 (×3): 1 g via ORAL
  Filled 2014-04-25 (×7): qty 1

## 2014-04-25 MED ORDER — INSULIN ASPART 100 UNIT/ML ~~LOC~~ SOLN
0.0000 [IU] | Freq: Three times a day (TID) | SUBCUTANEOUS | Status: DC
Start: 2014-04-25 — End: 2014-04-28
  Administered 2014-04-25: 3 [IU] via SUBCUTANEOUS
  Administered 2014-04-26: 2 [IU] via SUBCUTANEOUS
  Administered 2014-04-26 – 2014-04-27 (×3): 3 [IU] via SUBCUTANEOUS
  Administered 2014-04-27: 2 [IU] via SUBCUTANEOUS
  Administered 2014-04-28: 5 [IU] via SUBCUTANEOUS

## 2014-04-25 MED ORDER — IPRATROPIUM-ALBUTEROL 0.5-2.5 (3) MG/3ML IN SOLN
3.0000 mL | RESPIRATORY_TRACT | Status: DC
  Administered 2014-04-25 – 2014-04-27 (×7): 3 mL via RESPIRATORY_TRACT
  Filled 2014-04-25 (×9): qty 3

## 2014-04-25 MED ORDER — VANCOMYCIN HCL IN DEXTROSE 750-5 MG/150ML-% IV SOLN
750.0000 mg | INTRAVENOUS | Status: DC
  Administered 2014-04-26 – 2014-04-27 (×2): 750 mg via INTRAVENOUS
  Filled 2014-04-25 (×2): qty 150

## 2014-04-25 MED ORDER — ASPIRIN 325 MG PO TABS
325.0000 mg | ORAL_TABLET | Freq: Every day | ORAL | Status: DC
  Administered 2014-04-25 – 2014-04-27 (×3): 325 mg via ORAL
  Filled 2014-04-25 (×4): qty 1

## 2014-04-25 MED ORDER — MEMANTINE HCL ER 28 MG PO CP24: 28.0000 mg | ORAL_CAPSULE | Freq: Every day | ORAL | Status: DC

## 2014-04-25 MED ORDER — DONEPEZIL HCL 10 MG PO TABS
10.0000 mg | ORAL_TABLET | Freq: Every day | ORAL | Status: DC
  Filled 2014-04-25: qty 1

## 2014-04-25 MED ORDER — MEMANTINE HCL-DONEPEZIL HCL ER 28-10 MG PO CP24: 1.0000 | ORAL_CAPSULE | Freq: Every day | ORAL | Status: DC

## 2014-04-25 MED ORDER — ALBUTEROL SULFATE (2.5 MG/3ML) 0.083% IN NEBU: 2.5000 mg | INHALATION_SOLUTION | Freq: Three times a day (TID) | RESPIRATORY_TRACT | Status: DC | PRN

## 2014-04-25 MED ORDER — TEMAZEPAM 15 MG PO CAPS
30.0000 mg | ORAL_CAPSULE | Freq: Every day | ORAL | Status: DC
  Administered 2014-04-25: 30 mg via ORAL
  Filled 2014-04-25: qty 2

## 2014-04-25 MED ORDER — MEMANTINE HCL ER 28 MG PO CP24
28.0000 mg | ORAL_CAPSULE | Freq: Every day | ORAL | Status: DC
  Administered 2014-04-25: 28 mg via ORAL
  Filled 2014-04-25 (×3): qty 28

## 2014-04-25 MED ORDER — AZITHROMYCIN 500 MG IV SOLR
500.0000 mg | Freq: Once | INTRAVENOUS | Status: DC
  Filled 2014-04-25: qty 500

## 2014-04-25 MED ORDER — DONEPEZIL HCL 10 MG PO TABS
10.0000 mg | ORAL_TABLET | Freq: Every day | ORAL | Status: DC
  Administered 2014-04-25: 10 mg via ORAL
  Filled 2014-04-25 (×4): qty 1

## 2014-04-25 NOTE — ED Notes (Signed)
Pt arrives via EMS from home with SOB that began today. Pt with strong productive green/yellow cough. Ronchi throughout. Denies recent fever. Denies pain.

## 2014-04-25 NOTE — Progress Notes (Signed)
Patient on partial non-rebreather, O2 sats remaining 88-90% RR 25-30, HR 103. Physician notified of O2 saturation level as well as recent lab result of positive strep pneumo urinary antigen. Orders for ABG given.

## 2014-04-25 NOTE — ED Notes (Signed)
X-ray at bedside
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
## 2014-04-25 NOTE — Progress Notes (Addendum)
NP paged secondary to pt having 89-90% sats on NRB. RR increased without increased WOB. Lungs rhonchus. Also, strep pneumo urinary antigen positive. ABG ordered and NP to bedside after results. S: pt can not participate in ROS secondary to dementia. Per RN, respiratory effort has not increased. O: Frail, acutely ill appearing elderly WM in NAD. Disoriented but alert. VS reviewed. RR 24-30. No use of accessory muscles. O2 sat 89-90% on NRB. RRR. Lungs rhonchus, congested. Has a cough.  A/P: 1. Multilobar PNA with + strep pneumo antigen. On Vanc and Zosyn. 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 Given his age and lack of WOB or decreased mental status will continue to watch for now. Continue NRB. If respiratory effort increases or sats drop further, consider bipap. Although, I am not sure he could tolerate it given his dementia. Will speak to daughter before change in treatment. Clance Boll, NP [facility] Update: RN paged secondary to pt being more lethargic. VS remain the same except for a slightly more decreased BP. He continues to not have increased WOB. RR 25. HR stable. O2 sat 90%. No obvious focal neuro findings. Pt received restoril 30mg  at hs. This NP believes this change in sedation secondary to med. D/c restoril. Will continue to follow.  KJKG, NP Update: This NP called RN to check on pt. Pt easy to arouse now and VSS. Satting in the low 90s now.  KJKG, NP

## 2014-04-25 NOTE — ED Notes (Signed)
Condom catheter placed
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
## 2014-04-25 NOTE — Progress Notes (Signed)
Patient found to be more lethargic than at beginning of shift. Patient responsive to pain with facial grimace, unresponsive to voice or sternal rub. HR, RR, and O2 remain stable. BP dropped from 130/140's/50's to 100's/40's. On call NP notified of changes.

## 2014-04-25 NOTE — Plan of Care (Signed)
Problem: Consults Goal: Skin Care Protocol Initiated - if Braden Score 18 or less If consults are not indicated, leave blank or document N/A Outcome: Progressing Braden <18.    Goal: Nutrition Consult-if indicated Outcome: Progressing Nutrition consult per admission screening. Goal: Diabetes Guidelines if Diabetic/Glucose > 140 If diabetic or lab glucose is > 140 mg/dl - Initiate Diabetes/Hyperglycemia Guidelines & Document Interventions  Outcome: Progressing CBG ACHS with insulin coverage.
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
## 2014-04-25 NOTE — ED Notes (Signed)
Lactic acid results given to Ward, DO

## 2014-04-26 DIAGNOSIS — E440 Moderate protein-calorie malnutrition: Secondary | ICD-10-CM

## 2014-04-26 LAB — BASIC METABOLIC PANEL
Anion gap: 9 (ref 5–15)
BUN: 35 mg/dL — ABNORMAL HIGH (ref 6–23)
CHLORIDE: 100 meq/L (ref 96–112)
CO2: 24 mmol/L (ref 19–32)
Calcium: 9 mg/dL (ref 8.4–10.5)
Creatinine, Ser: 1.6 mg/dL — ABNORMAL HIGH (ref 0.50–1.35)
GFR calc Af Amer: 42 mL/min — ABNORMAL LOW (ref >90)
GFR, EST NON AFRICAN AMERICAN: 36 mL/min — AB (ref >90)
GLUCOSE: 258 mg/dL — AB (ref 70–99)
Potassium: 4.3 mmol/L (ref 3.5–5.1)
SODIUM: 133 mmol/L — AB (ref 135–145)

## 2014-04-26 LAB — GLUCOSE, CAPILLARY
GLUCOSE-CAPILLARY: 273 mg/dL — AB (ref 70–99)
GLUCOSE-CAPILLARY: 180 mg/dL — AB (ref 70–99)
Glucose-Capillary: 189 mg/dL — ABNORMAL HIGH (ref 70–99)
Glucose-Capillary: 126 mg/dL — ABNORMAL HIGH (ref 70–99)

## 2014-04-26 LAB — LEGIONELLA ANTIGEN, URINE

## 2014-04-26 LAB — CBC
HCT: 34.1 % — ABNORMAL LOW (ref 39.0–52.0)
Hemoglobin: 11.2 g/dL — ABNORMAL LOW (ref 13.0–17.0)
MCH: 28.1 pg (ref 26.0–34.0)
MCHC: 32.8 g/dL (ref 30.0–36.0)
MCV: 85.7 fL (ref 78.0–100.0)
PLATELETS: 153 10*3/uL (ref 150–400)
RBC: 3.98 MIL/uL — AB (ref 4.22–5.81)
RDW: 15.7 % — ABNORMAL HIGH (ref 11.5–15.5)
WBC: 9.7 10*3/uL (ref 4.0–10.5)

## 2014-04-26 LAB — HIV ANTIBODY (ROUTINE TESTING W REFLEX): HIV: NONREACTIVE

## 2014-04-26 LAB — LACTIC ACID, PLASMA: Lactic Acid, Venous: 2.9 mmol/L — ABNORMAL HIGH (ref 0.5–2.2)

## 2014-04-26 MED ORDER — MORPHINE SULFATE 4 MG/ML IJ SOLN: 4.0000 mg | Freq: Four times a day (QID) | INTRAMUSCULAR | Status: DC | PRN

## 2014-04-26 MED ORDER — LORAZEPAM 2 MG/ML IJ SOLN
1.0000 mg | Freq: Four times a day (QID) | INTRAMUSCULAR | Status: DC | PRN
  Administered 2014-04-27: 1 mg via INTRAVENOUS
  Filled 2014-04-26: qty 1

## 2014-04-26 MED ORDER — MORPHINE SULFATE 2 MG/ML IJ SOLN
2.0000 mg | Freq: Four times a day (QID) | INTRAMUSCULAR | Status: DC | PRN
  Administered 2014-04-26: 2 mg via INTRAVENOUS
  Filled 2014-04-26 (×2): qty 1

## 2014-04-26 MED ORDER — ENSURE COMPLETE PO LIQD
237.0000 mL | Freq: Three times a day (TID) | ORAL | Status: DC
  Administered 2014-04-26 – 2014-04-28 (×3): 237 mL via ORAL

## 2014-04-26 MED ORDER — MORPHINE SULFATE 2 MG/ML IJ SOLN
2.0000 mg | INTRAMUSCULAR | Status: DC | PRN
  Administered 2014-04-26 – 2014-04-28 (×4): 2 mg via INTRAVENOUS
  Filled 2014-04-26 (×4): qty 1

## 2014-04-26 MED ORDER — MORPHINE SULFATE 2 MG/ML IJ SOLN
2.0000 mg | Freq: Once | INTRAMUSCULAR | Status: AC
  Administered 2014-04-26: 2 mg via INTRAVENOUS

## 2014-04-26 NOTE — Progress Notes (Signed)
[facility]                                                                              Patient Demographics  Casey Whitney, is a 80 y.o. male, DOB - 1922-01-13, KDT:[id number]  Admit date - 04/05/2014   Admitting Physician Cristal Ford, DO  Outpatient Primary MD for the patient is FRIED, Jaymes Graff, MD  LOS - 1   Chief Complaint  Patient presents with  . Shortness of Breath        Assessment & Plan   Sepsis secondary to pneumonia versus UTI -Patient febrile, with tachycardia and tachypnea upon admission -Patient currently afebrile, continues to be tachypneic however tachycardia subsided -Chest x-ray: New bilateral multifocal pneumonia -Continue vancomycin and Zosyn per pharmacy -Lactic level 3.76, will repeat in the morning -Strep pneumonia urine antigen positive, HIV negative -Continue DuoNeb treatments, guaifenesin, IVF -Urine culture pending -Blood cultures show no growth to date  Acute hypoxic respiratory failure -Secondary to pneumonia -Patient be admitted to stepdown -Upon admission, patient's oxygen level was approximately 89% however improved with DuoNeb treatment and nonrebreather  Chronic diastolic heart failure  -Patient appears to be euvolemic at this time  -Continue to monitor daily weights, intake and output  -Holding Lasix due to sepsis  -Echocardiogram 07/28/2012 shows grade 1 diastolic dysfunction, with an EF of 60-65%   Hypertension -Controlled, continue amlodipine -Lasix held due to soft BP and sepsis  Diabetes mellitus, type II -metformin held -Continue ISS and CBG monitoring  Chronic kidney disease, stage III -Baseline creatinine appears to be 1.4-1.5 -Currently 1.60 -Will continue to monitor BMP and give gentle IVF  Hyperlipidemia -Continue statin  Insomnia -Continue temazepam  Dementia -Continue Namzaric  Code Status: DNR  Family Communication: None at bedside, daughter via phone  Disposition Plan:  Admitted  Time Spent in minutes   30 minutes  Procedures  None  Consults   None  DVT Prophylaxis  Lovenox  Lab Results  Component Value Date   PLT 153 04/26/2014    Medications  Scheduled Meds: . sodium chloride   Intravenous STAT  . amLODipine  5 mg Oral QPM  . aspirin  325 mg Oral Daily  . Memantine HCl ER  28 mg Oral QHS   And  . donepezil  10 mg Oral QHS  . enoxaparin (LOVENOX) injection  30 mg Subcutaneous Q24H  . feeding supplement (ENSURE COMPLETE)  237 mL Oral TID WC  . ferrous sulfate  325 mg Oral BID  . guaiFENesin  1,200 mg Oral BID  . insulin aspart  0-9 Units Subcutaneous TID WC  . ipratropium-albuterol  3 mL Nebulization Q4H  . omega-3 acid ethyl esters  1 g Oral BID  . oxybutynin  10 mg Oral Daily  . piperacillin-tazobactam (ZOSYN)  IV  3.375 g Intravenous Q8H  . pravastatin  40 mg Oral Daily  . vancomycin  750 mg Intravenous Q24H   Continuous Infusions: . sodium chloride 50 mL/hr at 04/26/14 1000   PRN Meds:.albuterol, morphine injection  Antibiotics    Anti-infectives    Start     Dose/Rate Route Frequency Ordered Stop   04/26/14 1000  vancomycin (VANCOCIN) IVPB 750 mg/150 ml premix  750 mg150 mL/hr over 60 Minutes Intravenous Every 24 hours 04/21/2014 1625 05/04/14 0959   04/15/2014 2000  piperacillin-tazobactam (ZOSYN) IVPB 3.375 g     3.375 g12.5 mL/hr over 240 Minutes Intravenous Every 8 hours 04/12/2014 1625     04/09/2014 1200  vancomycin (VANCOCIN) IVPB 1000 mg/200 mL premix     1,000 mg200 mL/hr over 60 Minutes Intravenous  Once 05/03/2014 1153 04/03/2014 1508   04/16/2014 1200  piperacillin-tazobactam (ZOSYN) IVPB 3.375 g     3.375 g12.5 mL/hr over 240 Minutes Intravenous  Once 04/08/2014 1153 04/05/2014 1605   04/24/2014 1145  cefTRIAXone (ROCEPHIN) 1 g in dextrose 5 % 50 mL IVPB  Status:  Discontinued     1 g100 mL/hr over 30 Minutes Intravenous  Once 04/30/2014 1136 04/26/2014 1153   04/05/2014 1145  azithromycin (ZITHROMAX) 500 mg in dextrose 5 % 250 mL  IVPB  Status:  Discontinued     500 mg250 mL/hr over 60 Minutes Intravenous  Once 04/20/2014 1136 04/09/2014 1153        Subjective:   [name] seen and examined today.  Patient states his breathing has improved slightly however continues to cough. Has no other complaints at this time.  Objective:   Filed Vitals:   04/26/14 1100 04/26/14 1200 04/26/14 1213 04/26/14 1300  BP: 114/50 134/55 134/55 116/51  Pulse: 89 92 89 88  Temp:   98 F (36.7 C)   TempSrc:   Axillary   Resp: 26 21 26  34  Height:      Weight:      SpO2: 92% 87% 87% 88%    Wt Readings from Last 3 Encounters:  04/26/14 61.6 kg (135 lb 12.9 oz)  06/21/13 62.5 kg (137 lb 12.6 oz)  04/20/13 68.629 kg (151 lb 4.8 oz)     Intake/Output Summary (Last 24 hours) at 04/26/14 1449 Last data filed at 04/26/14 1300  Gross per 24 hour  Intake 1348.34 ml  Output    150 ml  Net 1198.34 ml    Exam  General: Well developed, well nourished, NAD, appears stated age  2: NCAT, mucous membranes moist. NRB in place  Cardiovascular: S1 S2 auscultated, no rubs, murmurs or gallops. Regular rate and rhythm.  Respiratory: Coarse and rhonchorous breath sounds  Abdomen: Soft, nontender, nondistended, + bowel sounds  Extremities: warm dry without cyanosis clubbing or edema  Neuro: AAOx3, no focal deficits  Data Review   Micro Results Recent Results (from the past 240 hour(s))  Blood culture (routine x 2)     Status: None (Preliminary result)   Collection Time: 04/24/2014 10:21 AM  Result Value Ref Range Status   Specimen Description BLOOD LEFT ANTECUBITAL  Final   Special Requests BOTTLES DRAWN AEROBIC AND ANAEROBIC 5CC  Final   Culture  Setup Time   Final    04/21/2014 14:46 Performed at [facility]    Culture   Final           BLOOD CULTURE RECEIVED NO GROWTH TO DATE CULTURE WILL BE HELD FOR 5 DAYS BEFORE ISSUING A FINAL NEGATIVE REPORT Performed at [facility]    Report Status  PENDING  Incomplete  Blood culture (routine x 2)     Status: None (Preliminary result)   Collection Time: 04/30/2014 10:25 AM  Result Value Ref Range Status   Specimen Description BLOOD RIGHT ANTECUBITAL  Final   Special Requests BOTTLES DRAWN AEROBIC AND ANAEROBIC 5CC  Final   Culture  Setup Time  Final    04/14/2014 14:48 Performed at [facility]    Culture   Final           BLOOD CULTURE RECEIVED NO GROWTH TO DATE CULTURE WILL BE HELD FOR 5 DAYS BEFORE ISSUING A FINAL NEGATIVE REPORT Performed at [facility]    Report Status PENDING  Incomplete  MRSA PCR Screening     Status: Abnormal   Collection Time: 04/06/2014  4:07 PM  Result Value Ref Range Status   MRSA by PCR POSITIVE (A) NEGATIVE Final    Comment:        The GeneXpert MRSA Assay (FDA approved for NASAL specimens only), is one component of a comprehensive MRSA colonization surveillance program. It is not intended to diagnose MRSA infection nor to guide or monitor treatment for MRSA infections. RESULT CALLED TO, READ BACK BY AND VERIFIED WITH: Jeralyn Bennett RN 17:45 04/26/2014 ([email or web address])     Radiology Reports Dg Chest Port 1 View  04/18/2014   CLINICAL DATA:  Shortness of breath  EXAM: PORTABLE CHEST - 1 VIEW  COMPARISON:  12/19/2013  FINDINGS: Cardiac shadow is stable. The previously seen parenchymal scarring is again identified. Some new infiltrative changes are noted in the bases bilaterally as well as in the right upper lobe. No bony abnormality is seen.  IMPRESSION: New bilateral multifocal pneumonia.   Electronically Signed   By: Inez Catalina M.D.   On: 04/23/2014 10:52    CBC  Recent Labs Lab 04/28/2014 1013 04/24/2014 1728 04/26/14 0324  WBC 8.6 6.8 9.7  HGB 12.8* 11.8* 11.2*  HCT 37.5* 34.6* 34.1*  PLT 182 167 153  MCV 87.2 87.2 85.7  MCH 29.8 29.7 28.1  MCHC 34.1 34.1 32.8  RDW 15.7* 15.7* 15.7*  LYMPHSABS 0.9  --   --   MONOABS 0.2  --   --   EOSABS 0.0  --   --   BASOSABS 0.0  --    --     Chemistries   Recent Labs Lab 04/04/2014 1013 04/18/2014 1728 04/26/14 0324  NA 136  --  133*  K 4.9  --  4.3  CL 100  --  100  CO2 22  --  24  GLUCOSE 208*  --  258*  BUN 41*  --  35*  CREATININE 1.74* 1.61* 1.60*  CALCIUM 9.9  --  9.0   ------------------------------------------------------------------------------------------------------------------ estimated creatinine clearance is 26.7 mL/min (by C-G formula based on Cr of 1.6). ------------------------------------------------------------------------------------------------------------------ No results for input(s): HGBA1C in the last 72 hours. ------------------------------------------------------------------------------------------------------------------ No results for input(s): CHOL, HDL, LDLCALC, TRIG, CHOLHDL, LDLDIRECT in the last 72 hours. ------------------------------------------------------------------------------------------------------------------ No results for input(s): TSH, T4TOTAL, T3FREE, THYROIDAB in the last 72 hours.  Invalid input(s): FREET3 ------------------------------------------------------------------------------------------------------------------ No results for input(s): VITAMINB12, FOLATE, FERRITIN, TIBC, IRON, RETICCTPCT in the last 72 hours.  Coagulation profile No results for input(s): INR, PROTIME in the last 168 hours.  No results for input(s): DDIMER in the last 72 hours.  Cardiac Enzymes No results for input(s): CKMB, TROPONINI, MYOGLOBIN in the last 168 hours.  Invalid input(s): CK ------------------------------------------------------------------------------------------------------------------ Invalid input(s): POCBNP    Edson Deridder D.O. on 04/26/2014 at 2:49 PM  Between 7am to 7pm - Pager - [phone]  After 7pm go to [email or web address] - password [email or web address]  And look for the night coverage person covering for me after hours  [facility] Office   [phone]

## 2014-04-26 NOTE — Progress Notes (Signed)
Patient lethargic and having difficultly swallowing pureed foods and thin liquids.  Unable to safely give evening PO medications at this time.  MD made aware and will continue to monitor. Riner, Ardeth Sportsman

## 2014-04-26 NOTE — Progress Notes (Signed)
Patient weak and having difficulty swallowing. Unsafe to give PO medications. MD notified

## 2014-04-26 NOTE — Progress Notes (Signed)
Utilization review completed. Dayshon Roback, RN, BSN. 

## 2014-04-26 NOTE — Progress Notes (Signed)
MD made aware of patient's oxygen stats maintaining around 86-89% on NRM.  Patient is tachypneic but appears comfortable.  New orders received and will continue to monitor. Aspinwall, Ardeth Sportsman
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
## 2014-04-26 NOTE — Care Management Note (Signed)
    Page 1 of 1   04/26/2014     2:33:05 PM CARE MANAGEMENT NOTE 04/26/2014  Patient:  Casey Whitney, Casey Whitney   Account Number:  [id number]  Date Initiated:  04/26/2014  Documentation initiated by:  Shauni Henner  Subjective/Objective Assessment:   dx sepsis; lives with family    PCP  Briscoe Deutscher     Action/Plan:   Anticipated DC Date:  04/30/2014   Anticipated DC Plan:  LONG TERM ACUTE CARE (LTAC)      DC Planning Services  CM consult      Choice offered to / List presented to:             Status of service:  In process, will continue to follow Medicare Important Message given?   (If response is "NO", the following Medicare IM given date fields will be blank) Date Medicare IM given:   Medicare IM given by:   Date Additional Medicare IM given:   Additional Medicare IM given by:    Discharge Disposition:    Per UR Regulation:  Reviewed for med. necessity/level of care/duration of stay  If discussed at [place] of Stay Meetings, dates discussed:    Comments:  04/26/14 [place] Received referral and requested LTAC screen.

## 2014-04-27 ENCOUNTER — Inpatient Hospital Stay (HOSPITAL_COMMUNITY): Payer: Medicare Other

## 2014-04-27 LAB — BASIC METABOLIC PANEL
ANION GAP: 14 (ref 5–15)
BUN: 37 mg/dL — ABNORMAL HIGH (ref 6–23)
CALCIUM: 9.1 mg/dL (ref 8.4–10.5)
CO2: 21 mmol/L (ref 19–32)
CREATININE: 1.69 mg/dL — AB (ref 0.50–1.35)
Chloride: 105 mEq/L (ref 96–112)
GFR calc non Af Amer: 34 mL/min — ABNORMAL LOW (ref >90)
GFR, EST AFRICAN AMERICAN: 39 mL/min — AB (ref >90)
Glucose, Bld: 213 mg/dL — ABNORMAL HIGH (ref 70–99)
Potassium: 4.3 mmol/L (ref 3.5–5.1)
Sodium: 140 mmol/L (ref 135–145)

## 2014-04-27 LAB — TSH: TSH: 2.456 u[IU]/mL (ref 0.350–4.500)

## 2014-04-27 LAB — GLUCOSE, CAPILLARY
GLUCOSE-CAPILLARY: 177 mg/dL — AB (ref 70–99)
Glucose-Capillary: 181 mg/dL — ABNORMAL HIGH (ref 70–99)
Glucose-Capillary: 250 mg/dL — ABNORMAL HIGH (ref 70–99)
Glucose-Capillary: 227 mg/dL — ABNORMAL HIGH (ref 70–99)

## 2014-04-27 LAB — CBC
HCT: 33.2 % — ABNORMAL LOW (ref 39.0–52.0)
Hemoglobin: 11.1 g/dL — ABNORMAL LOW (ref 13.0–17.0)
MCH: 28.6 pg (ref 26.0–34.0)
MCHC: 33.4 g/dL (ref 30.0–36.0)
MCV: 85.6 fL (ref 78.0–100.0)
Platelets: 155 10*3/uL (ref 150–400)
RBC: 3.88 MIL/uL — ABNORMAL LOW (ref 4.22–5.81)
RDW: 16 % — AB (ref 11.5–15.5)
WBC: 9.8 10*3/uL (ref 4.0–10.5)

## 2014-04-27 LAB — MAGNESIUM: Magnesium: 2.1 mg/dL (ref 1.5–2.5)

## 2014-04-27 MED ORDER — DEXTROSE 5 % IV SOLN
5.0000 mg/h | INTRAVENOUS | Status: DC
  Administered 2014-04-27 (×2): 10 mg/h via INTRAVENOUS
  Administered 2014-04-27: 5 mg/h via INTRAVENOUS
  Filled 2014-04-27: qty 100

## 2014-04-27 MED ORDER — LEVALBUTEROL HCL 0.63 MG/3ML IN NEBU
0.6300 mg | INHALATION_SOLUTION | Freq: Three times a day (TID) | RESPIRATORY_TRACT | Status: DC
  Administered 2014-04-27 – 2014-04-29 (×5): 0.63 mg via RESPIRATORY_TRACT
  Filled 2014-04-27 (×9): qty 3

## 2014-04-27 MED ORDER — GUAIFENESIN 100 MG/5ML PO SOLN
5.0000 mL | Freq: Four times a day (QID) | ORAL | Status: DC | PRN
  Filled 2014-04-27: qty 5

## 2014-04-27 MED ORDER — MORPHINE SULFATE 2 MG/ML IJ SOLN
1.0000 mg | Freq: Once | INTRAMUSCULAR | Status: AC
Start: 2014-04-27 — End: 2014-04-27
  Administered 2014-04-27: 1 mg via INTRAVENOUS
  Filled 2014-04-27: qty 1

## 2014-04-27 MED ORDER — DILTIAZEM LOAD VIA INFUSION
10.0000 mg | Freq: Once | INTRAVENOUS | Status: AC
  Administered 2014-04-27: 10 mg via INTRAVENOUS
  Filled 2014-04-27: qty 10

## 2014-04-27 MED ORDER — MEMANTINE HCL 10 MG PO TABS
10.0000 mg | ORAL_TABLET | Freq: Two times a day (BID) | ORAL | Status: DC
  Filled 2014-04-27 (×3): qty 1

## 2014-04-27 NOTE — Progress Notes (Signed)
Patient appeared to be in [place] on the monitor. EKG done to verify. HR 120's-140's, BP 121/67. On call physician Alene Mires) paged. Awaiting call back at this time.

## 2014-04-27 NOTE — Progress Notes (Signed)
On call physician paged second time regarding Afib-RVR, awaiting response at this time.

## 2014-04-27 NOTE — Progress Notes (Signed)
Shift Event RN paged secondary to pt with tachycardia, HR in 130s. Pt sleepy but easily arousable, asymptomatic, other VSS .  EKG shows Afib with RVR.     Afib with RVR -HR in 130s, EKG- Afib with RVR- Through chart review, pt with no known hx of Afib.  -BP 120/51 -Will start on cardiazem, 10mg  bolus, and gtt at 5-15mg /hr.  [facility] [phone]

## 2014-04-27 NOTE — Progress Notes (Addendum)
[facility]                                                                              Patient Demographics  Casey Whitney, is a 80 y.o. male, DOB - 1921/07/28, OFB:[id number]  Admit date - 04/13/2014   Admitting Physician Cristal Ford, DO  Outpatient Primary MD for the patient is FRIED, Jaymes Graff, MD  LOS - 2   Chief Complaint  Patient presents with  . 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 Patient was placed on vancomycin and Zosyn to cover for community-acquired pneumonia versus aspiration pneumonia as well as urinary tract infection. She pneumonia urine antigen positive. Blood culture showed no growth to date. Patient developed A. fib RVR, will check TSH and magnesium levels. Family does not wish aggressive measures.  Assessment & Plan   Sepsis secondary to CAP/aspiration pneumonia versus UTI -Patient febrile, with tachycardia and tachypnea upon admission -Patient currently afebrile, continues to be tachypneic however tachycardia subsided -Chest x-ray: New bilateral multifocal pneumonia -Continue Zosyn per pharmacy, vancomycin discontinued -Lactic level 3.76, trending downward 2.9 -Strep pneumonia urine antigen positive, HIV negative -Continue DuoNeb treatments, guaifenesin, IVF -Urine culture pending -Blood cultures show no growth to date  Acute hypoxic respiratory failure -Secondary to pneumonia -Upon admission, patient's oxygen level was approximately 89% however improved with DuoNeb treatment and nonrebreather -After speaking with the daughter, patient's baseline saturation is about 88-89%  -Daughter would like repeat CXR  New Atrial fibrillation with RVR -Spoke with daughter, will not consult cardiology at this time -Likey secondary to respiratory status and sepsis -Patient placed on cardizem drip -Will  check TSH and magnesium  Chronic diastolic heart failure  -Patient appears to be euvolemic at this time  -Continue to monitor daily weights, intake and output  -Holding Lasix due to sepsis  -Echocardiogram 07/28/2012 shows grade 1 diastolic dysfunction, with an EF of 60-65%   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 -Keeps O2 sats in upper 80s   Hypertension -Controlled, continue amlodipine -Lasix held due to soft BP and sepsis  Diabetes mellitus, type II -metformin held -Continue ISS and CBG monitoring  Chronic kidney disease, stage III -Baseline creatinine appears to be 1.4-1.5 -Currently 1.69 -Will continue to monitor BMP and give gentle IVF  Hyperlipidemia -Continue statin  Insomnia -Continue temazepam  Dementia -Continue Namzaric  Moderate malnutrition -Nutrition consulted -Continue feeding supplement  Code Status: DNR  Family Communication: None at bedside, daughter via phone  Disposition Plan: Admitted  Time Spent in minutes   30 minutes  Procedures  None  Consults   None  DVT Prophylaxis  Lovenox  Lab Results  Component Value Date   PLT 155 04/27/2014    Medications  Scheduled Meds: . amLODipine  5 mg Oral QPM  . aspirin  325 mg Oral Daily  . Memantine HCl ER  28 mg Oral QHS   And  . donepezil  10 mg Oral QHS  . enoxaparin (LOVENOX) injection  30 mg Subcutaneous Q24H  . feeding supplement (ENSURE COMPLETE)  237 mL Oral TID  WC  . ferrous sulfate  325 mg Oral BID  . guaiFENesin  1,200 mg Oral BID  . insulin aspart  0-9 Units Subcutaneous TID WC  . ipratropium-albuterol  3 mL Nebulization Q4H  . omega-3 acid ethyl esters  1 g Oral BID  . oxybutynin  10 mg Oral Daily  . piperacillin-tazobactam (ZOSYN)  IV  3.375 g Intravenous Q8H  . pravastatin  40 mg Oral Daily  . vancomycin  750 mg Intravenous Q24H   Continuous Infusions: . sodium chloride 50 mL/hr at 04/27/14 0423  . diltiazem (CARDIZEM) infusion 10 mg/hr (04/27/14  0915)   PRN Meds:.albuterol, LORazepam, morphine injection  Antibiotics    Anti-infectives    Start     Dose/Rate Route Frequency Ordered Stop   04/26/14 1000  vancomycin (VANCOCIN) IVPB 750 mg/150 ml premix     750 mg150 mL/hr over 60 Minutes Intravenous Every 24 hours 04/17/2014 1625 05/04/14 0959   04/30/2014 2000  piperacillin-tazobactam (ZOSYN) IVPB 3.375 g     3.375 g12.5 mL/hr over 240 Minutes Intravenous Every 8 hours 04/09/2014 1625     04/08/2014 1200  vancomycin (VANCOCIN) IVPB 1000 mg/200 mL premix     1,000 mg200 mL/hr over 60 Minutes Intravenous  Once 04/05/2014 1153 04/06/2014 1508   04/24/2014 1200  piperacillin-tazobactam (ZOSYN) IVPB 3.375 g     3.375 g12.5 mL/hr over 240 Minutes Intravenous  Once 04/30/2014 1153 04/28/2014 1605   04/07/2014 1145  cefTRIAXone (ROCEPHIN) 1 g in dextrose 5 % 50 mL IVPB  Status:  Discontinued     1 g100 mL/hr over 30 Minutes Intravenous  Once 04/04/2014 1136 04/30/2014 1153   04/29/2014 1145  azithromycin (ZITHROMAX) 500 mg in dextrose 5 % 250 mL IVPB  Status:  Discontinued     500 mg250 mL/hr over 60 Minutes Intravenous  Once 04/17/2014 1136 05/03/2014 1153        Subjective:   Casey Whitney seen and examined today.  Patient states he is "as good as can be expected" .  Denies chest pain, abdominal pain. Objective:   Filed Vitals:   04/27/14 0645 04/27/14 0700 04/27/14 0800 04/27/14 0900  BP: 114/69 109/69 114/74 141/77  Pulse: 129 148 132 45  Temp:  98.1 F (36.7 C)    TempSrc:  Axillary    Resp: 31 35 38 31  Height:      Weight:      SpO2: 90% 90% 90% 80%    Wt Readings from Last 3 Encounters:  04/27/14 61.2 kg (134 lb 14.7 oz)  06/21/13 62.5 kg (137 lb 12.6 oz)  04/20/13 68.629 kg (151 lb 4.8 oz)     Intake/Output Summary (Last 24 hours) at 04/27/14 3295 Last data filed at 04/27/14 0700  Gross per 24 hour  Intake 978.33 ml  Output      0 ml  Net 978.33 ml    Exam  General: Well developed, NAD, appears stated age  22: NCAT,  mucous membranes moist. NRB in place  Cardiovascular: S1 S2 auscultated, irregularly irregular  Respiratory: Coarse and rhonchorous breath sounds, shallow breaths  Abdomen: Soft, nontender, nondistended, + bowel sounds  Extremities: warm dry without cyanosis clubbing or edema  Data Review   Micro Results Recent Results (from the past 240 hour(s))  Blood culture (routine x 2)     Status: None (Preliminary result)   Collection Time: 04/13/2014 10:21 AM  Result Value Ref Range Status   Specimen Description BLOOD LEFT ANTECUBITAL  Final   Special Requests  BOTTLES DRAWN AEROBIC AND ANAEROBIC 5CC  Final   Culture  Setup Time   Final    04/03/2014 14:46 Performed at [facility]    Culture   Final           BLOOD CULTURE RECEIVED NO GROWTH TO DATE CULTURE WILL BE HELD FOR 5 DAYS BEFORE ISSUING A FINAL NEGATIVE REPORT Performed at [facility]    Report Status PENDING  Incomplete  Blood culture (routine x 2)     Status: None (Preliminary result)   Collection Time: 04/26/2014 10:25 AM  Result Value Ref Range Status   Specimen Description BLOOD RIGHT ANTECUBITAL  Final   Special Requests BOTTLES DRAWN AEROBIC AND ANAEROBIC 5CC  Final   Culture  Setup Time   Final    04/05/2014 14:48 Performed at [facility]    Culture   Final           BLOOD CULTURE RECEIVED NO GROWTH TO DATE CULTURE WILL BE HELD FOR 5 DAYS BEFORE ISSUING A FINAL NEGATIVE REPORT Performed at [facility]    Report Status PENDING  Incomplete  Urine culture     Status: None (Preliminary result)   Collection Time: 04/15/2014  2:23 PM  Result Value Ref Range Status   Specimen Description URINE, RANDOM  Final   Special Requests NONE  Final   Culture  Setup Time   Final    04/26/2014 02:40 Performed at [place] PENDING  Incomplete   Culture   Final    Culture reincubated for better growth Performed at [facility]    Report Status PENDING   Incomplete  MRSA PCR Screening     Status: Abnormal   Collection Time: 04/13/2014  4:07 PM  Result Value Ref Range Status   MRSA by PCR POSITIVE (A) NEGATIVE Final    Comment:        The GeneXpert MRSA Assay (FDA approved for NASAL specimens only), is one component of a comprehensive MRSA colonization surveillance program. It is not intended to diagnose MRSA infection nor to guide or monitor treatment for MRSA infections. RESULT CALLED TO, READ BACK BY AND VERIFIED WITH: Jeralyn Bennett RN 17:45 04/13/2014 ([email or web address])     Radiology Reports Dg Chest Port 1 View  04/13/2014   CLINICAL DATA:  Shortness of breath  EXAM: PORTABLE CHEST - 1 VIEW  COMPARISON:  12/19/2013  FINDINGS: Cardiac shadow is stable. The previously seen parenchymal scarring is again identified. Some new infiltrative changes are noted in the bases bilaterally as well as in the right upper lobe. No bony abnormality is seen.  IMPRESSION: New bilateral multifocal pneumonia.   Electronically Signed   By: Inez Catalina M.D.   On: 04/17/2014 10:52    CBC  Recent Labs Lab 05/03/2014 1013 04/10/2014 1728 04/26/14 0324 04/27/14 0319  WBC 8.6 6.8 9.7 9.8  HGB 12.8* 11.8* 11.2* 11.1*  HCT 37.5* 34.6* 34.1* 33.2*  PLT 182 167 153 155  MCV 87.2 87.2 85.7 85.6  MCH 29.8 29.7 28.1 28.6  MCHC 34.1 34.1 32.8 33.4  RDW 15.7* 15.7* 15.7* 16.0*  LYMPHSABS 0.9  --   --   --   MONOABS 0.2  --   --   --   EOSABS 0.0  --   --   --   BASOSABS 0.0  --   --   --     Chemistries   Recent Labs Lab 04/03/2014 1013 04/24/2014  1728 04/26/14 0324 04/27/14 0319  NA 136  --  133* 140  K 4.9  --  4.3 4.3  CL 100  --  100 105  CO2 22  --  24 21  GLUCOSE 208*  --  258* 213*  BUN 41*  --  35* 37*  CREATININE 1.74* 1.61* 1.60* 1.69*  CALCIUM 9.9  --  9.0 9.1   ------------------------------------------------------------------------------------------------------------------ estimated creatinine clearance is 25.1 mL/min (by C-G formula based on  Cr of 1.69). ------------------------------------------------------------------------------------------------------------------ No results for input(s): HGBA1C in the last 72 hours. ------------------------------------------------------------------------------------------------------------------ No results for input(s): CHOL, HDL, LDLCALC, TRIG, CHOLHDL, LDLDIRECT in the last 72 hours. ------------------------------------------------------------------------------------------------------------------ No results for input(s): TSH, T4TOTAL, T3FREE, THYROIDAB in the last 72 hours.  Invalid input(s): FREET3 ------------------------------------------------------------------------------------------------------------------ No results for input(s): VITAMINB12, FOLATE, FERRITIN, TIBC, IRON, RETICCTPCT in the last 72 hours.  Coagulation profile No results for input(s): INR, PROTIME in the last 168 hours.  No results for input(s): DDIMER in the last 72 hours.  Cardiac Enzymes No results for input(s): CKMB, TROPONINI, MYOGLOBIN in the last 168 hours.  Invalid input(s): CK ------------------------------------------------------------------------------------------------------------------ Invalid input(s): POCBNP   Erby Sanderson D.O. on 04/27/2014 at 9:42 AM  Between 7am to 7pm - Pager - [phone]  After 7pm go to [email or web address] - password [email or web address]  And look for the night coverage person covering for me after hours  [facility] Office  [phone]

## 2014-04-28 DIAGNOSIS — G929 Unspecified toxic encephalopathy: Secondary | ICD-10-CM | POA: Diagnosis present

## 2014-04-28 DIAGNOSIS — I4891 Unspecified atrial fibrillation: Secondary | ICD-10-CM | POA: Diagnosis present

## 2014-04-28 DIAGNOSIS — G92 Toxic encephalopathy: Secondary | ICD-10-CM | POA: Diagnosis present

## 2014-04-28 DIAGNOSIS — J60 Coalworker's pneumoconiosis: Secondary | ICD-10-CM | POA: Diagnosis present

## 2014-04-28 LAB — BASIC METABOLIC PANEL
Anion gap: 13 (ref 5–15)
BUN: 45 mg/dL — AB (ref 6–23)
CHLORIDE: 109 meq/L (ref 96–112)
CO2: 21 mmol/L (ref 19–32)
Calcium: 8.9 mg/dL (ref 8.4–10.5)
Creatinine, Ser: 1.78 mg/dL — ABNORMAL HIGH (ref 0.50–1.35)
GFR calc Af Amer: 37 mL/min — ABNORMAL LOW (ref >90)
GFR calc non Af Amer: 32 mL/min — ABNORMAL LOW (ref >90)
Glucose, Bld: 254 mg/dL — ABNORMAL HIGH (ref 70–99)
Potassium: 3.9 mmol/L (ref 3.5–5.1)
Sodium: 143 mmol/L (ref 135–145)

## 2014-04-28 LAB — CBC
HEMATOCRIT: 33.5 % — AB (ref 39.0–52.0)
Hemoglobin: 11.1 g/dL — ABNORMAL LOW (ref 13.0–17.0)
MCH: 28.2 pg (ref 26.0–34.0)
MCHC: 33.1 g/dL (ref 30.0–36.0)
MCV: 85.2 fL (ref 78.0–100.0)
Platelets: 148 10*3/uL — ABNORMAL LOW (ref 150–400)
RBC: 3.93 MIL/uL — AB (ref 4.22–5.81)
RDW: 16.2 % — AB (ref 11.5–15.5)
WBC: 12.5 10*3/uL — AB (ref 4.0–10.5)

## 2014-04-28 LAB — GLUCOSE, CAPILLARY
GLUCOSE-CAPILLARY: 234 mg/dL — AB (ref 70–99)
Glucose-Capillary: 244 mg/dL — ABNORMAL HIGH (ref 70–99)

## 2014-04-28 MED ORDER — CEFTRIAXONE SODIUM IN DEXTROSE 40 MG/ML IV SOLN
2.0000 g | INTRAVENOUS | Status: DC
  Administered 2014-04-28: 2 g via INTRAVENOUS
  Filled 2014-04-28: qty 50

## 2014-04-28 MED ORDER — MORPHINE SULFATE 2 MG/ML IJ SOLN
2.0000 mg | INTRAMUSCULAR | Status: DC | PRN
  Administered 2014-04-28 – 2014-04-30 (×8): 2 mg via INTRAVENOUS
  Filled 2014-04-28 (×9): qty 1

## 2014-04-28 MED ORDER — ACETAMINOPHEN 650 MG RE SUPP
650.0000 mg | Freq: Four times a day (QID) | RECTAL | Status: DC | PRN
Start: 2014-04-28 — End: 2014-05-01

## 2014-04-28 MED ORDER — INSULIN DETEMIR 100 UNIT/ML ~~LOC~~ SOLN
5.0000 [IU] | Freq: Every day | SUBCUTANEOUS | Status: DC
  Administered 2014-04-28: 5 [IU] via SUBCUTANEOUS
  Filled 2014-04-28: qty 0.05

## 2014-04-28 MED ORDER — LEVOFLOXACIN IN D5W 750 MG/150ML IV SOLN
750.0000 mg | Freq: Once | INTRAVENOUS | Status: AC
Start: 2014-04-28 — End: 2014-04-28
  Administered 2014-04-28: 750 mg via INTRAVENOUS
  Filled 2014-04-28: qty 150

## 2014-04-28 MED ORDER — LIDOCAINE HCL 2 % EX GEL
1.0000 "application " | Freq: Once | CUTANEOUS | Status: AC
  Administered 2014-04-28: 1 via URETHRAL
  Filled 2014-04-28: qty 20

## 2014-04-28 MED ORDER — MORPHINE SULFATE 2 MG/ML IJ SOLN
2.0000 mg | INTRAMUSCULAR | Status: DC
  Administered 2014-04-28 (×3): 2 mg via INTRAVENOUS
  Filled 2014-04-28 (×3): qty 1

## 2014-04-28 MED ORDER — ACETAMINOPHEN 650 MG RE SUPP
650.0000 mg | Freq: Once | RECTAL | Status: AC
  Administered 2014-04-28: 650 mg via RECTAL
  Filled 2014-04-28: qty 1

## 2014-04-28 MED ORDER — LEVOFLOXACIN IN D5W 500 MG/100ML IV SOLN: 500.0000 mg | INTRAVENOUS | Status: DC

## 2014-04-28 MED ORDER — LORAZEPAM 2 MG/ML IJ SOLN
1.0000 mg | INTRAMUSCULAR | Status: DC | PRN
  Administered 2014-04-28: 1 mg via INTRAVENOUS
  Filled 2014-04-28: qty 1

## 2014-04-28 MED ORDER — FUROSEMIDE 10 MG/ML IJ SOLN
40.0000 mg | Freq: Once | INTRAMUSCULAR | Status: AC
  Administered 2014-04-28: 40 mg via INTRAVENOUS
  Filled 2014-04-28: qty 4

## 2014-04-28 NOTE — Progress Notes (Signed)
[place] Therapist, sports and [place] RN performed foley huddle. Agreed it would be appropriate d/t pt's resp status and current state. [facility] agreed to order. [place] RN, Letta Moynahan RN and Oncologist attempted foley insertion using sterile technique with 16 and 14Fr Foley. Met resistance and were unsuccessful. [facility] agreed to order coude cath.

## 2014-04-28 NOTE — Progress Notes (Signed)
Chart reviewed.    [facility]                                                                              Patient Demographics  Casey Whitney, is a 80 y.o. male, DOB - 10/26/1921, QAS:[id number]  Admit date - 04/19/2014   Admitting Physician Cristal Ford, DO  Outpatient Primary MD for the patient is FRIED, Jaymes Graff, MD  LOS - 3   Chief Complaint  Patient presents with  . 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 Patient was placed on vancomycin and Zosyn to cover for community-acquired pneumonia versus aspiration pneumonia as well as urinary tract infection. Strep pneumonia urine antigen positive. Blood culture showed no growth to date. Patient developed A. fib RVR, will check TSH and magnesium levels. Family does not wish aggressive measures.  Assessment & Plan   Sepsis secondary to pneumococcal pneumonia and Escherichia coli urinary tract infection. Chest x-ray worsening. Patient encephalopathic. Tachypneic.  Per nursing, not taking PO  Apparently, several family members at bedside requesting comfort measures and a palliative care consult. I spoke with son at bedside, Octavia Bruckner who deferred decisions to Randell Patient who is reportedly the power of attorney. Discussed with her. She is agreeable to a palliative care consult, but does not want measures withdrawn at this point.  Will adjust antibiotics to double cover pneumococcal pneumonia. await Escherichia coli sensitivities.  Prognosis poor.  Acute hypoxic respiratory failure -Secondary to pneumonia Per daughter, chronic hypoxia. H/o lung disease from coal mining  E coli UTI: Sensitivities pending.  New Atrial fibrillation with RVR Now in sinus rhythm. TSH and mag ok. Family did not want extensive workup, per Dr. Barbaraann Rondo.  Likely from above. On cardizem gtt.  Chronic diastolic heart  failure  -Echocardiogram 07/28/2012 shows grade 1 diastolic dysfunction, with an EF of 60-65%. Weights stable.  Will give a dose of lasix to see if this helps. Pro BNP only 180 on admission.  History of coal miner pneumoconiosis Likely a degree of chronic hypoxia/respiratory failure.  Toxic encephalopathy Per nursing staff, not taking medications. Will D/C oral medications.  Diabetes mellitus, type II Continue sliding scale  Chronic kidney disease, stage III -Baseline creatinine appears to be 1.4-1.5 -Currently 1.7  Hyperlipidemia  Insomnia  Dementia -Continue Namzaric  Moderate malnutrition -Nutrition consulted -Continue feeding supplement  Code Status: DNR  Family Communication: son, Tim at bedside. Daughter, Charlene by pnone  Disposition Plan:   Time Spent in minutes   45 minutes  Procedures  None  Consults   None  DVT Prophylaxis  Lovenox  Lab Results  Component Value Date   PLT 148* 04/28/2014    Medications  Scheduled Meds: . amLODipine  5 mg Oral QPM  . aspirin  325 mg Oral Daily  . donepezil  10 mg Oral QHS  . enoxaparin (LOVENOX) injection  30 mg Subcutaneous Q24H  . feeding supplement (ENSURE COMPLETE)  237 mL Oral TID WC  . ferrous sulfate  325 mg Oral BID  . insulin aspart  0-9 Units Subcutaneous TID WC  . levalbuterol  0.63 mg Nebulization TID  . memantine  10 mg Oral BID  . omega-3 acid ethyl esters  1 g Oral BID  . oxybutynin  10 mg Oral Daily  . piperacillin-tazobactam (ZOSYN)  IV  3.375 g Intravenous Q8H  . pravastatin  40 mg Oral Daily   Continuous Infusions: . sodium chloride 50 mL/hr at 04/28/14 0340  . diltiazem (CARDIZEM) infusion 7.5 mg/hr (04/28/14 0800)   PRN Meds:.guaiFENesin, LORazepam, morphine injection  Antibiotics    Anti-infectives    Start     Dose/Rate Route Frequency Ordered Stop   04/26/14 1000  vancomycin (VANCOCIN) IVPB 750 mg/150 ml premix  Status:  Discontinued     750 mg150 mL/hr over 60 Minutes  Intravenous Every 24 hours 04/26/2014 1625 04/27/14 1134   04/29/2014 2000  piperacillin-tazobactam (ZOSYN) IVPB 3.375 g     3.375 g12.5 mL/hr over 240 Minutes Intravenous Every 8 hours 04/26/2014 1625     04/15/2014 1200  vancomycin (VANCOCIN) IVPB 1000 mg/200 mL premix     1,000 mg200 mL/hr over 60 Minutes Intravenous  Once 04/17/2014 1153 04/30/2014 1508   04/20/2014 1200  piperacillin-tazobactam (ZOSYN) IVPB 3.375 g     3.375 g12.5 mL/hr over 240 Minutes Intravenous  Once 04/24/2014 1153 04/27/2014 1605   04/10/2014 1145  cefTRIAXone (ROCEPHIN) 1 g in dextrose 5 % 50 mL IVPB  Status:  Discontinued     1 g100 mL/hr over 30 Minutes Intravenous  Once 04/06/2014 1136 04/06/2014 1153   04/27/2014 1145  azithromycin (ZITHROMAX) 500 mg in dextrose 5 % 250 mL IVPB  Status:  Discontinued     500 mg250 mL/hr over 60 Minutes Intravenous  Once 04/20/2014 1136 04/12/2014 1153        Subjective:   Unable. Nonverbal.   Objective:   Filed Vitals:   04/28/14 0400 04/28/14 0500 04/28/14 0600 04/28/14 0754  BP: 133/53 139/65 126/96 152/57  Pulse: 81 81 84 88  Temp:    99.5 F (37.5 C)  TempSrc:    Axillary  Resp: 31 30 29 30   Height:      Weight:      SpO2: 92% 91% 90% 87%    Wt Readings from Last 3 Encounters:  04/28/14 60.737 kg (133 lb 14.4 oz)  06/21/13 62.5 kg (137 lb 12.6 oz)  04/20/13 68.629 kg (151 lb 4.8 oz)     Intake/Output Summary (Last 24 hours) at 04/28/14 0817 Last data filed at 04/28/14 0800  Gross per 24 hour  Intake 1926.88 ml  Output    402 ml  Net 1524.88 ml    Exam  General: somnolent. Briefly arousable. 2.   Cardiovascular: regular rate rhythm without murmurs gallops rubs   Respiratory: tachypnea. Mild to moderate respiratory distress.  Abdomen: Soft, nontender, nondistended, + bowel sounds  Extremities: warm dry without cyanosis clubbing or edema  Data Review   Micro Results Recent Results (from the past 240 hour(s))  Blood culture (routine x 2)     Status: None  (Preliminary result)   Collection Time: 04/18/2014 10:21 AM  Result Value Ref Range Status   Specimen Description BLOOD LEFT ANTECUBITAL  Final   Special Requests BOTTLES DRAWN AEROBIC AND ANAEROBIC 5CC  Final   Culture  Setup Time   Final    04/30/2014 14:46 Performed at [facility]    Culture   Final           BLOOD CULTURE RECEIVED NO GROWTH TO DATE CULTURE WILL BE HELD FOR 5 DAYS  BEFORE ISSUING A FINAL NEGATIVE REPORT Performed at [facility]    Report Status PENDING  Incomplete  Blood culture (routine x 2)     Status: None (Preliminary result)   Collection Time: 04/24/2014 10:25 AM  Result Value Ref Range Status   Specimen Description BLOOD RIGHT ANTECUBITAL  Final   Special Requests BOTTLES DRAWN AEROBIC AND ANAEROBIC 5CC  Final   Culture  Setup Time   Final    04/14/2014 14:48 Performed at [facility]    Culture   Final           BLOOD CULTURE RECEIVED NO GROWTH TO DATE CULTURE WILL BE HELD FOR 5 DAYS BEFORE ISSUING A FINAL NEGATIVE REPORT Performed at [facility]    Report Status PENDING  Incomplete  Urine culture     Status: None (Preliminary result)   Collection Time: 04/08/2014  2:23 PM  Result Value Ref Range Status   Specimen Description URINE, RANDOM  Final   Special Requests NONE  Final   Culture  Setup Time   Final    04/26/2014 02:40 Performed at [place]   Final    >=100,000 COLONIES/ML Performed at [facility]    Culture   Final    ESCHERICHIA COLI Performed at [facility]    Report Status PENDING  Incomplete  MRSA PCR Screening     Status: Abnormal   Collection Time: 04/21/2014  4:07 PM  Result Value Ref Range Status   MRSA by PCR POSITIVE (A) NEGATIVE Final    Comment:        The GeneXpert MRSA Assay (FDA approved for NASAL specimens only), is one component of a comprehensive MRSA colonization surveillance program. It is not intended to diagnose MRSA infection nor to  guide or monitor treatment for MRSA infections. RESULT CALLED TO, READ BACK BY AND VERIFIED WITH: Jeralyn Bennett RN 17:45 04/24/2014 ([email or web address])     Radiology Reports Dg Chest Port 1 View  04/27/2014   CLINICAL DATA:  Pneumonia.  Dyspnea.  Weakness.  EXAM: PORTABLE CHEST - 1 VIEW  COMPARISON:  04/03/2014  FINDINGS: Worsening bilateral airspace opacities with consolidation much more dense than the left perihilar region and right upper lobe.  Atherosclerotic aortic arch. Obscuration of the left heart border. Underlying interstitial accentuation bilaterally in the lungs.  Upper thoracic scoliosis along with thoracic spondylosis.  IMPRESSION: 1. Worsening of bilateral airspace opacities favoring multi lobar pneumonia.   Electronically Signed   By: Sherryl Barters M.D.   On: 04/27/2014 10:40   Dg Chest Port 1 View  04/27/2014   CLINICAL DATA:  Shortness of breath  EXAM: PORTABLE CHEST - 1 VIEW  COMPARISON:  12/19/2013  FINDINGS: Cardiac shadow is stable. The previously seen parenchymal scarring is again identified. Some new infiltrative changes are noted in the bases bilaterally as well as in the right upper lobe. No bony abnormality is seen.  IMPRESSION: New bilateral multifocal pneumonia.   Electronically Signed   By: Inez Catalina M.D.   On: 04/07/2014 10:52    CBC  Recent Labs Lab 04/17/2014 1013 04/09/2014 1728 04/26/14 0324 04/27/14 0319 04/28/14 0314  WBC 8.6 6.8 9.7 9.8 12.5*  HGB 12.8* 11.8* 11.2* 11.1* 11.1*  HCT 37.5* 34.6* 34.1* 33.2* 33.5*  PLT 182 167 153 155 148*  MCV 87.2 87.2 85.7 85.6 85.2  MCH 29.8 29.7 28.1 28.6 28.2  MCHC 34.1 34.1 32.8 33.4 33.1  RDW 15.7* 15.7*  15.7* 16.0* 16.2*  LYMPHSABS 0.9  --   --   --   --   MONOABS 0.2  --   --   --   --   EOSABS 0.0  --   --   --   --   BASOSABS 0.0  --   --   --   --     Chemistries   Recent Labs Lab 04/24/2014 1013 04/07/2014 1728 04/26/14 0324 04/27/14 0319 04/27/14 1048 04/28/14 0314  NA 136  --  133* 140  --  143  K  4.9  --  4.3 4.3  --  3.9  CL 100  --  100 105  --  109  CO2 22  --  24 21  --  21  GLUCOSE 208*  --  258* 213*  --  254*  BUN 41*  --  35* 37*  --  45*  CREATININE 1.74* 1.61* 1.60* 1.69*  --  1.78*  CALCIUM 9.9  --  9.0 9.1  --  8.9  MG  --   --   --   --  2.1  --    ------------------------------------------------------------------------------------------------------------------ estimated creatinine clearance is 23.7 mL/min (by C-G formula based on Cr of 1.78). ------------------------------------------------------------------------------------------------------------------ No results for input(s): HGBA1C in the last 72 hours. ------------------------------------------------------------------------------------------------------------------ No results for input(s): CHOL, HDL, LDLCALC, TRIG, CHOLHDL, LDLDIRECT in the last 72 hours. ------------------------------------------------------------------------------------------------------------------  Recent Labs  04/27/14 1346  TSH 2.456   ------------------------------------------------------------------------------------------------------------------ No results for input(s): VITAMINB12, FOLATE, FERRITIN, TIBC, IRON, RETICCTPCT in the last 72 hours.  Coagulation profile No results for input(s): INR, PROTIME in the last 168 hours.  No results for input(s): DDIMER in the last 72 hours.  Cardiac Enzymes No results for input(s): CKMB, TROPONINI, MYOGLOBIN in the last 168 hours.  Invalid input(s): CK ------------------------------------------------------------------------------------------------------------------ Invalid input(s): POCBNP   Alvis Pulcini L on 04/28/2014 at 8:17 AM  Text page [email or web address] - password [email or web address]  [facility]

## 2014-04-28 NOTE — Progress Notes (Signed)
Discussed with wife, daughter in laws. They would like to stop all but comfort measures, including abx, cardizem, etc. Will schedule morphine transfer to floor. Reassess tomorrow.  Doree Barthel, MD [facility]

## 2014-04-28 NOTE — Progress Notes (Signed)
Report called to [facility] RN 6N; family at bedside; questions encouraged and answered; NRB mask removed and nasal cannula applied per protocol; cardizem gtt DC'd per MD orders; pt shakes head no to pain; will continue to monitor

## 2014-04-28 NOTE — Progress Notes (Signed)
No output from foley. Bladder scan performed. 154mL noted. Flushed foley. Blood tinged urine noted. Will continue to monitor.

## 2014-04-28 NOTE — Progress Notes (Signed)
Coude catheter placed at 0035, no urine return, bladder scanned for 0 ml, RN reported pt was incont prior to insertion. Small amount of blood noted w/ 2 very small clots. RN to assess and monitor urine output. Quantia Grullon, Dione Plover

## 2014-04-29 LAB — URINE CULTURE: Colony Count: >=100000

## 2014-04-29 MED ORDER — LEVALBUTEROL HCL 0.63 MG/3ML IN NEBU: 0.6300 mg | INHALATION_SOLUTION | Freq: Four times a day (QID) | RESPIRATORY_TRACT | Status: DC | PRN

## 2014-04-29 NOTE — Progress Notes (Signed)
Chart reviewed.    [facility]                                                                              Patient Demographics  Casey Whitney, is a 80 y.o. male, DOB - 03/23/1922, ASN:[id number]  Admit date - 04/16/2014   Admitting Physician Cristal Ford, DO  Outpatient Primary MD for the patient is FRIED, Jaymes Graff, MD  LOS - 4   Chief Complaint  Patient presents with  . 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 Patient was placed on vancomycin and Zosyn to cover for community-acquired pneumonia versus aspiration pneumonia as well as urinary tract infection. Strep pneumonia urine antigen positive. Blood culture showed no growth to date. Patient developed A. fib RVR, will check TSH and magnesium levels. Now comfort care  Assessment & Plan   Appears comfortable. Continue scheduled morphine. Consider residential hospice if comfortable and relatively stable tomorrow. V. gip   Time Spent in minutes   15 min  Lab Results  Component Value Date   PLT 148* 04/28/2014    Medications  Scheduled Meds: . levalbuterol  0.63 mg Nebulization TID   Continuous Infusions: . sodium chloride Stopped (04/28/14 1600)   PRN Meds:.acetaminophen, guaiFENesin, LORazepam, morphine injection  Antibiotics    Anti-infectives    Start     Dose/Rate Route Frequency Ordered Stop   04/30/14 1000  levofloxacin (LEVAQUIN) IVPB 500 mg  Status:  Discontinued     500 mg100 mL/hr over 60 Minutes Intravenous Every 48 hours 04/28/14 0827 04/28/14 1246   04/28/14 1000  cefTRIAXone (ROCEPHIN) 2 g in dextrose 5 % 50 mL IVPB - Premix  Status:  Discontinued     2 g100 mL/hr over 30 Minutes Intravenous Every 24 hours 04/28/14 0827 04/28/14 1246   04/28/14 1000  levofloxacin (LEVAQUIN) IVPB 750 mg     750 mg100 mL/hr over 90 Minutes Intravenous  Once 04/28/14  0919 04/28/14 1125   04/26/14 1000  vancomycin (VANCOCIN) IVPB 750 mg/150 ml premix  Status:  Discontinued     750 mg150 mL/hr over 60 Minutes Intravenous Every 24 hours 04/17/2014 1625 04/27/14 1134   04/21/2014 2000  piperacillin-tazobactam (ZOSYN) IVPB 3.375 g  Status:  Discontinued     3.375 g12.5 mL/hr over 240 Minutes Intravenous Every 8 hours 04/07/2014 1625 04/28/14 0827   04/07/2014 1200  vancomycin (VANCOCIN) IVPB 1000 mg/200 mL premix     1,000 mg200 mL/hr over 60 Minutes Intravenous  Once 04/06/2014 1153 04/09/2014 1508   04/24/2014 1200  piperacillin-tazobactam (ZOSYN) IVPB 3.375 g     3.375 g12.5 mL/hr over 240 Minutes Intravenous  Once 04/06/2014 1153 04/13/2014 1605   04/16/2014 1145  cefTRIAXone (ROCEPHIN) 1 g in dextrose 5 % 50 mL IVPB  Status:  Discontinued     1 g100 mL/hr over 30 Minutes Intravenous  Once 04/14/2014 1136 04/11/2014 1153   04/12/2014 1145  azithromycin (ZITHROMAX) 500 mg in dextrose 5 % 250 mL IVPB  Status:  Discontinued     500 mg250 mL/hr over 60 Minutes Intravenous  Once 04/19/2014  1136 04/05/2014 1153        Subjective:   Unable. Nonverbal.   Objective:   Filed Vitals:   04/28/14 1640 04/28/14 2040 04/29/14 0613 04/29/14 0923  BP: 146/74  150/71   Pulse: 99  100   Temp:   97.9 F (36.6 C)   TempSrc:   Axillary   Resp: 20  17   Height:      Weight:      SpO2: 75% 79% 90% 87%    Wt Readings from Last 3 Encounters:  04/28/14 60.737 kg (133 lb 14.4 oz)  06/21/13 62.5 kg (137 lb 12.6 oz)  04/20/13 68.629 kg (151 lb 4.8 oz)     Intake/Output Summary (Last 24 hours) at 04/29/14 1735 Last data filed at 04/29/14 0618  Gross per 24 hour  Intake      0 ml  Output    300 ml  Net   -300 ml    Exam  General: eyes closed. Appears comfortable  Cardiovascular: regular rate rhythm without murmurs gallops rubs   Respiratory: CTA.   Abdomen: Soft, nontender, nondistended, + bowel sounds  Extremities: warm dry without cyanosis clubbing or edema  Data Review    Micro Results Recent Results (from the past 240 hour(s))  Blood culture (routine x 2)     Status: None (Preliminary result)   Collection Time: 04/07/2014 10:21 AM  Result Value Ref Range Status   Specimen Description BLOOD LEFT ANTECUBITAL  Final   Special Requests BOTTLES DRAWN AEROBIC AND ANAEROBIC 5CC  Final   Culture  Setup Time   Final    04/27/2014 14:46 Performed at [facility]    Culture   Final           BLOOD CULTURE RECEIVED NO GROWTH TO DATE CULTURE WILL BE HELD FOR 5 DAYS BEFORE ISSUING A FINAL NEGATIVE REPORT Performed at [facility]    Report Status PENDING  Incomplete  Blood culture (routine x 2)     Status: None (Preliminary result)   Collection Time: 04/27/2014 10:25 AM  Result Value Ref Range Status   Specimen Description BLOOD RIGHT ANTECUBITAL  Final   Special Requests BOTTLES DRAWN AEROBIC AND ANAEROBIC 5CC  Final   Culture  Setup Time   Final    04/28/2014 14:48 Performed at [facility]    Culture   Final           BLOOD CULTURE RECEIVED NO GROWTH TO DATE CULTURE WILL BE HELD FOR 5 DAYS BEFORE ISSUING A FINAL NEGATIVE REPORT Performed at [facility]    Report Status PENDING  Incomplete  Urine culture     Status: None   Collection Time: 04/24/2014  2:23 PM  Result Value Ref Range Status   Specimen Description URINE, RANDOM  Final   Special Requests NONE  Final   Culture  Setup Time   Final    04/26/2014 02:40 Performed at [place]   Final    >=100,000 COLONIES/ML Performed at [facility]    Culture   Final    ESCHERICHIA COLI Performed at [facility]    Report Status 04/29/2014 FINAL  Final   Organism ID, Bacteria ESCHERICHIA COLI  Final      Susceptibility   Escherichia coli - MIC*    AMPICILLIN 8 SENSITIVE Sensitive     CEFAZOLIN <=4 SENSITIVE Sensitive     CEFTRIAXONE <=1 SENSITIVE Sensitive  CIPROFLOXACIN <=0.25 SENSITIVE Sensitive     GENTAMICIN <=1  SENSITIVE Sensitive     LEVOFLOXACIN <=0.12 SENSITIVE Sensitive     NITROFURANTOIN 32 SENSITIVE Sensitive     TOBRAMYCIN <=1 SENSITIVE Sensitive     TRIMETH/SULFA <=20 SENSITIVE Sensitive     PIP/TAZO <=4 SENSITIVE Sensitive     * ESCHERICHIA COLI  MRSA PCR Screening     Status: Abnormal   Collection Time: 04/15/2014  4:07 PM  Result Value Ref Range Status   MRSA by PCR POSITIVE (A) NEGATIVE Final    Comment:        The GeneXpert MRSA Assay (FDA approved for NASAL specimens only), is one component of a comprehensive MRSA colonization surveillance program. It is not intended to diagnose MRSA infection nor to guide or monitor treatment for MRSA infections. RESULT CALLED TO, READ BACK BY AND VERIFIED WITH: Jeralyn Bennett RN 17:45 04/09/2014 ([email or web address])     Radiology Reports Dg Chest Port 1 View  04/27/2014   CLINICAL DATA:  Pneumonia.  Dyspnea.  Weakness.  EXAM: PORTABLE CHEST - 1 VIEW  COMPARISON:  04/26/2014  FINDINGS: Worsening bilateral airspace opacities with consolidation much more dense than the left perihilar region and right upper lobe.  Atherosclerotic aortic arch. Obscuration of the left heart border. Underlying interstitial accentuation bilaterally in the lungs.  Upper thoracic scoliosis along with thoracic spondylosis.  IMPRESSION: 1. Worsening of bilateral airspace opacities favoring multi lobar pneumonia.   Electronically Signed   By: Sherryl Barters M.D.   On: 04/27/2014 10:40   Dg Chest Port 1 View  04/30/2014   CLINICAL DATA:  Shortness of breath  EXAM: PORTABLE CHEST - 1 VIEW  COMPARISON:  12/19/2013  FINDINGS: Cardiac shadow is stable. The previously seen parenchymal scarring is again identified. Some new infiltrative changes are noted in the bases bilaterally as well as in the right upper lobe. No bony abnormality is seen.  IMPRESSION: New bilateral multifocal pneumonia.   Electronically Signed   By: Inez Catalina M.D.   On: 04/30/2014 10:52    CBC  Recent Labs Lab  04/12/2014 1013 04/30/2014 1728 04/26/14 0324 04/27/14 0319 04/28/14 0314  WBC 8.6 6.8 9.7 9.8 12.5*  HGB 12.8* 11.8* 11.2* 11.1* 11.1*  HCT 37.5* 34.6* 34.1* 33.2* 33.5*  PLT 182 167 153 155 148*  MCV 87.2 87.2 85.7 85.6 85.2  MCH 29.8 29.7 28.1 28.6 28.2  MCHC 34.1 34.1 32.8 33.4 33.1  RDW 15.7* 15.7* 15.7* 16.0* 16.2*  LYMPHSABS 0.9  --   --   --   --   MONOABS 0.2  --   --   --   --   EOSABS 0.0  --   --   --   --   BASOSABS 0.0  --   --   --   --     Chemistries   Recent Labs Lab 04/04/2014 1013 04/30/2014 1728 04/26/14 0324 04/27/14 0319 04/27/14 1048 04/28/14 0314  NA 136  --  133* 140  --  143  K 4.9  --  4.3 4.3  --  3.9  CL 100  --  100 105  --  109  CO2 22  --  24 21  --  21  GLUCOSE 208*  --  258* 213*  --  254*  BUN 41*  --  35* 37*  --  45*  CREATININE 1.74* 1.61* 1.60* 1.69*  --  1.78*  CALCIUM 9.9  --  9.0 9.1  --  8.9  MG  --   --   --   --  2.1  --    ------------------------------------------------------------------------------------------------------------------ estimated creatinine clearance is 23.7 mL/min (by C-G formula based on Cr of 1.78). ------------------------------------------------------------------------------------------------------------------ No results for input(s): HGBA1C in the last 72 hours. ------------------------------------------------------------------------------------------------------------------ No results for input(s): CHOL, HDL, LDLCALC, TRIG, CHOLHDL, LDLDIRECT in the last 72 hours. ------------------------------------------------------------------------------------------------------------------  Recent Labs  04/27/14 1346  TSH 2.456   ------------------------------------------------------------------------------------------------------------------ No results for input(s): VITAMINB12, FOLATE, FERRITIN, TIBC, IRON, RETICCTPCT in the last 72 hours.  Coagulation profile No results for input(s): INR, PROTIME in the last  168 hours.  No results for input(s): DDIMER in the last 72 hours.  Cardiac Enzymes No results for input(s): CKMB, TROPONINI, MYOGLOBIN in the last 168 hours.  Invalid input(s): CK ------------------------------------------------------------------------------------------------------------------ Invalid input(s): POCBNP   Brallan Denio L on 04/29/2014 at 5:35 PM  Text page [email or web address] - password [facility]  [facility]

## 2014-04-29 NOTE — Plan of Care (Signed)
Problem: Phase I Progression Outcomes Goal: Pain controlled with appropriate interventions Outcome: Progressing Stops moaning and breathing become easier

## 2014-04-29 NOTE — Plan of Care (Signed)
Problem: Phase I Progression Outcomes Goal: Code status addressed with pt/family Outcome: Completed/Met Date Met:  04/29/14 Made DNR

## 2014-04-30 DIAGNOSIS — Z515 Encounter for palliative care: Secondary | ICD-10-CM

## 2014-04-30 MED ORDER — SODIUM CHLORIDE 0.9 % IV SOLN
1.0000 mg/h | INTRAVENOUS | Status: DC
  Administered 2014-04-30: 3 mg/h via INTRAVENOUS
  Filled 2014-04-30: qty 10

## 2014-04-30 MED ORDER — WHITE PETROLATUM GEL
Status: AC
  Administered 2014-04-30: 14:00:00
  Filled 2014-04-30: qty 5

## 2014-04-30 MED ORDER — MORPHINE SULFATE 2 MG/ML IJ SOLN
2.0000 mg | INTRAMUSCULAR | Status: DC
  Administered 2014-04-30 (×2): 2 mg via INTRAVENOUS
  Filled 2014-04-30 (×2): qty 1

## 2014-04-30 NOTE — Progress Notes (Signed)
Referral request made to [facility] to evaluate for GIP admission.  Confirmed with Dr. Conley Canal regarding attending services.  She will remain attending if eligibility is approved.  Will follow up in AM with RNCM and [facility] liaison.  Ludwig Clarks, MSN, [facility]

## 2014-04-30 NOTE — Clinical Social Work Note (Signed)
CSW following for GIP vs residential hospice placement.  Per Dr note decision to be made 05/19/14 in the am.  Domenica Reamer, [place] Social Worker [phone]

## 2014-04-30 NOTE — Progress Notes (Signed)
Spoke with wife at bedside about hospice options. Wife stated that her daughters are making the decisions for the patient at this time but that she is in agreement with everything so far.  She stated that they all felt like the patient might pass within the next 12-24 hours.  I explained that there were options for hospice and wife is agreeable to having someone speak with the daughters.  She did state that they all preferred that the patient remain here which would be the GIP option.  Will follow up in the morning.   Sandi Mariscal, RN BSN [facility] CCM  Case Manager, Trauma Service/Unit [facility] [phone]

## 2014-04-30 NOTE — Progress Notes (Signed)
Nutrition Brief Note  Chart reviewed. Pt now transitioning to comfort care.  No further nutrition interventions warranted at this time.  Please re-consult as needed.   Jiyan Walkowski A. Jimmye Norman, RD, LDN Pager: [phone] After hours Pager: [phone]

## 2014-04-30 NOTE — Progress Notes (Signed)
[facility]                                                                              Patient Demographics  Casey Whitney, is a 80 y.o. male, DOB - Apr 28, 1922, QIO:[id number]  Admit date - 05/02/2014   Admitting Physician Cristal Ford, DO  Outpatient Primary MD for the patient is FRIED, Jaymes Graff, MD  LOS - 5   Chief Complaint  Patient presents with  . 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 Patient was placed on vancomycin and Zosyn to cover for community-acquired pneumonia versus aspiration pneumonia as well as urinary tract infection. Strep pneumonia urine antigen positive. Blood culture showed no growth to date. Patient developed A. fib RVR, will check TSH and magnesium levels. Now comfort care  Assessment & Plan   Appears comfortable. Continue scheduled morphine. Would be a candidate for residential hospice v. GIP. D/w CM. Consulted SW.   Time Spent in minutes   15 min  Lab Results  Component Value Date   PLT 148* 04/28/2014    Medications  Scheduled Meds: .  morphine injection  2 mg Intravenous Q4H   Continuous Infusions: . sodium chloride Stopped (04/28/14 1600)   PRN Meds:.acetaminophen, guaiFENesin, levalbuterol, LORazepam   Subjective:   Unable. Nonverbal.   Objective:   Filed Vitals:   04/28/14 2040 04/29/14 0613 04/29/14 0923 04/30/14 0507  BP:  150/71  150/70  Pulse:  100  116  Temp:  97.9 F (36.6 C)  100.1 F (37.8 C)  TempSrc:  Axillary  Axillary  Resp:  17  16  Height:      Weight:      SpO2: 79% 90% 87% 77%    Wt Readings from Last 3 Encounters:  04/28/14 60.737 kg (133 lb 14.4 oz)  06/21/13 62.5 kg (137 lb 12.6 oz)  04/20/13 68.629 kg (151 lb 4.8 oz)     Intake/Output Summary (Last 24 hours) at 04/30/14 1038 Last data filed at 04/30/14 0508  Gross per 24 hour  Intake       0 ml  Output    300 ml  Net   -300 ml    Exam  General: eyes closed. Opens to voice.  Cardiovascular: regular rate rhythm without murmurs gallops rubs   Respiratory: CTA.   Abdomen: Soft, nontender, nondistended, + bowel sounds  Extremities: warm dry without cyanosis clubbing or edema  Data Review   Micro Results Recent Results (from the past 240 hour(s))  Blood culture (routine x 2)     Status: None (Preliminary result)   Collection Time: 04/10/2014 10:21 AM  Result Value Ref Range Status   Specimen Description BLOOD LEFT ANTECUBITAL  Final   Special Requests BOTTLES DRAWN AEROBIC AND ANAEROBIC 5CC  Final   Culture  Setup Time   Final    04/16/2014 14:46 Performed at [facility]    Culture   Final           BLOOD CULTURE RECEIVED NO GROWTH TO DATE CULTURE WILL BE HELD FOR 5 DAYS BEFORE ISSUING  A FINAL NEGATIVE REPORT Performed at [facility]    Report Status PENDING  Incomplete  Blood culture (routine x 2)     Status: None (Preliminary result)   Collection Time: 04/17/2014 10:25 AM  Result Value Ref Range Status   Specimen Description BLOOD RIGHT ANTECUBITAL  Final   Special Requests BOTTLES DRAWN AEROBIC AND ANAEROBIC 5CC  Final   Culture  Setup Time   Final    04/13/2014 14:48 Performed at [facility]    Culture   Final           BLOOD CULTURE RECEIVED NO GROWTH TO DATE CULTURE WILL BE HELD FOR 5 DAYS BEFORE ISSUING A FINAL NEGATIVE REPORT Performed at [facility]    Report Status PENDING  Incomplete  Urine culture     Status: None   Collection Time: 04/12/2014  2:23 PM  Result Value Ref Range Status   Specimen Description URINE, RANDOM  Final   Special Requests NONE  Final   Culture  Setup Time   Final    04/26/2014 02:40 Performed at [place]   Final    >=100,000 COLONIES/ML Performed at [facility]    Culture   Final    ESCHERICHIA COLI Performed at [facility]     Report Status 04/29/2014 FINAL  Final   Organism ID, Bacteria ESCHERICHIA COLI  Final      Susceptibility   Escherichia coli - MIC*    AMPICILLIN 8 SENSITIVE Sensitive     CEFAZOLIN <=4 SENSITIVE Sensitive     CEFTRIAXONE <=1 SENSITIVE Sensitive     CIPROFLOXACIN <=0.25 SENSITIVE Sensitive     GENTAMICIN <=1 SENSITIVE Sensitive     LEVOFLOXACIN <=0.12 SENSITIVE Sensitive     NITROFURANTOIN 32 SENSITIVE Sensitive     TOBRAMYCIN <=1 SENSITIVE Sensitive     TRIMETH/SULFA <=20 SENSITIVE Sensitive     PIP/TAZO <=4 SENSITIVE Sensitive     * ESCHERICHIA COLI  MRSA PCR Screening     Status: Abnormal   Collection Time: 04/06/2014  4:07 PM  Result Value Ref Range Status   MRSA by PCR POSITIVE (A) NEGATIVE Final    Comment:        The GeneXpert MRSA Assay (FDA approved for NASAL specimens only), is one component of a comprehensive MRSA colonization surveillance program. It is not intended to diagnose MRSA infection nor to guide or monitor treatment for MRSA infections. RESULT CALLED TO, READ BACK BY AND VERIFIED WITH: Jeralyn Bennett RN 17:45 04/13/2014 ([email or web address])     Radiology Reports Dg Chest Port 1 View  04/27/2014   CLINICAL DATA:  Pneumonia.  Dyspnea.  Weakness.  EXAM: PORTABLE CHEST - 1 VIEW  COMPARISON:  04/24/2014  FINDINGS: Worsening bilateral airspace opacities with consolidation much more dense than the left perihilar region and right upper lobe.  Atherosclerotic aortic arch. Obscuration of the left heart border. Underlying interstitial accentuation bilaterally in the lungs.  Upper thoracic scoliosis along with thoracic spondylosis.  IMPRESSION: 1. Worsening of bilateral airspace opacities favoring multi lobar pneumonia.   Electronically Signed   By: Sherryl Barters M.D.   On: 04/27/2014 10:40   Dg Chest Port 1 View  04/29/2014   CLINICAL DATA:  Shortness of breath  EXAM: PORTABLE CHEST - 1 VIEW  COMPARISON:  12/19/2013  FINDINGS: Cardiac shadow is stable. The previously seen  parenchymal scarring is again identified. Some new infiltrative changes are noted in the bases bilaterally as  well as in the right upper lobe. No bony abnormality is seen.  IMPRESSION: New bilateral multifocal pneumonia.   Electronically Signed   By: Inez Catalina M.D.   On: 04/28/2014 10:52    CBC  Recent Labs Lab 04/08/2014 1013 04/30/2014 1728 04/26/14 0324 04/27/14 0319 04/28/14 0314  WBC 8.6 6.8 9.7 9.8 12.5*  HGB 12.8* 11.8* 11.2* 11.1* 11.1*  HCT 37.5* 34.6* 34.1* 33.2* 33.5*  PLT 182 167 153 155 148*  MCV 87.2 87.2 85.7 85.6 85.2  MCH 29.8 29.7 28.1 28.6 28.2  MCHC 34.1 34.1 32.8 33.4 33.1  RDW 15.7* 15.7* 15.7* 16.0* 16.2*  LYMPHSABS 0.9  --   --   --   --   MONOABS 0.2  --   --   --   --   EOSABS 0.0  --   --   --   --   BASOSABS 0.0  --   --   --   --     Chemistries   Recent Labs Lab 04/21/2014 1013 04/28/2014 1728 04/26/14 0324 04/27/14 0319 04/27/14 1048 04/28/14 0314  NA 136  --  133* 140  --  143  K 4.9  --  4.3 4.3  --  3.9  CL 100  --  100 105  --  109  CO2 22  --  24 21  --  21  GLUCOSE 208*  --  258* 213*  --  254*  BUN 41*  --  35* 37*  --  45*  CREATININE 1.74* 1.61* 1.60* 1.69*  --  1.78*  CALCIUM 9.9  --  9.0 9.1  --  8.9  MG  --   --   --   --  2.1  --    ------------------------------------------------------------------------------------------------------------------ estimated creatinine clearance is 23.7 mL/min (by C-G formula based on Cr of 1.78). ------------------------------------------------------------------------------------------------------------------ No results for input(s): HGBA1C in the last 72 hours. ------------------------------------------------------------------------------------------------------------------ No results for input(s): CHOL, HDL, LDLCALC, TRIG, CHOLHDL, LDLDIRECT in the last 72 hours. ------------------------------------------------------------------------------------------------------------------  Recent Labs   04/27/14 1346  TSH 2.456   ------------------------------------------------------------------------------------------------------------------ No results for input(s): VITAMINB12, FOLATE, FERRITIN, TIBC, IRON, RETICCTPCT in the last 72 hours.  Coagulation profile No results for input(s): INR, PROTIME in the last 168 hours.  No results for input(s): DDIMER in the last 72 hours.  Cardiac Enzymes No results for input(s): CKMB, TROPONINI, MYOGLOBIN in the last 168 hours.  Invalid input(s): CK ------------------------------------------------------------------------------------------------------------------ Invalid input(s): POCBNP   Jaysin Gayler L on 04/30/2014 at 10:38 AM  Text page [email or web address] - password [email or web address]  [facility]

## 2014-05-01 DIAGNOSIS — J13 Pneumonia due to Streptococcus pneumoniae: Secondary | ICD-10-CM

## 2014-05-01 LAB — CULTURE, BLOOD (ROUTINE X 2)
Culture: NO GROWTH
Culture: NO GROWTH

## 2014-05-04 NOTE — Progress Notes (Signed)
Pt was pronounced dead at 0540 this am by Tor Netters, RN and witnessed by Hosie Spangle, RN. Family notified. MD notified. [facility] called and notified for referral.

## 2014-05-04 NOTE — Progress Notes (Signed)
Morphine drip wasted 210 ml witnessed by Hosie Spangle, RN.

## 2014-05-04 DEATH — deceased

## 2014-06-04 NOTE — Discharge Summary (Signed)
Death Summary  Lennin Osmond AVW:[id number] DOB: 04-29-1922 DOA: 2014/05/13  PCP: Abigail Miyamoto, MD   Admit date: 05-13-14 Date of Death: 05-19-14 Time of death 0540  Final Diagnoses:  Cause of death   Pneumococcal Pneumonia Other diagnosis contributing   CAP (community acquired pneumonia)  Chronic diastolic heart failure   Sepsis   Chronic kidney disease, stage III (moderate)   Hyperlipidemia   Acute respiratory failure with hypoxia   Malnutrition of moderate degree   Encephalopathy, toxic   Atrial fibrillation   Coal miners' pneumoconiosis  History of present illness:  81 y.o. male  With a history of hypertension, hyperlipidemia, diabetes, presented to the emergency department with complaints of cough and shortness of breath. Per daughter, patient did have cough and shortness of breath which started on Monday which was productive. He does have green-yellow sputum production. Patient denies any recent travel, sick contacts or fever, chills. Per daughter, shortness of breath began today. 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 Patient was brought to the emergency department, found to be hypoxic and placed on nonrebreather. Chest x-ray did show multifocal pneumonia  Hospital Course:  Admitted to stepdown unit. Started on broad spectrum antibiotics. Urine pneumococcal antigen positive. Also found to have e coli UTI.  Developed atrial fibrillation with RVR, requiring cardizem gtt antigen positive. Also found to have e coli UTI.  Condition deteriorated, less responsive, renal failure worsened, stop taking PO. After discussion with family, patient made comfort care.  Started on morphine and other measures withdrawn. Patient died comfortably while awaiting a hospice bed.  SignedDelfina Redwood  [facility] 05/06/2014, 4:56 PM

## 2015-12-01 IMAGING — CT CT CERVICAL SPINE W/O CM
4 of 5 series · 15 of 33 positions shown, 17 images · non-contrast
Comparison: Head CT April 23, 2013

CLINICAL DATA: Pain post trauma

EXAM:
CT HEAD WITHOUT CONTRAST
CT MAXILLOFACIAL WITHOUT CONTRAST
CT CERVICAL SPINE WITHOUT CONTRAST
TECHNIQUE: Multidetector CT imaging of the head, cervical spine, and
maxillofacial structures were performed using the standard protocol
without intravenous contrast. Multiplanar CT image reconstructions
of the cervical spine and maxillofacial structures were also
generated.

[Series 17: c_spine 2.0 b41s st · axial · 0.31mm/px · z∈[+955,+1025]mm · 3 of 88 slices shown]
[im 18/88  bone]
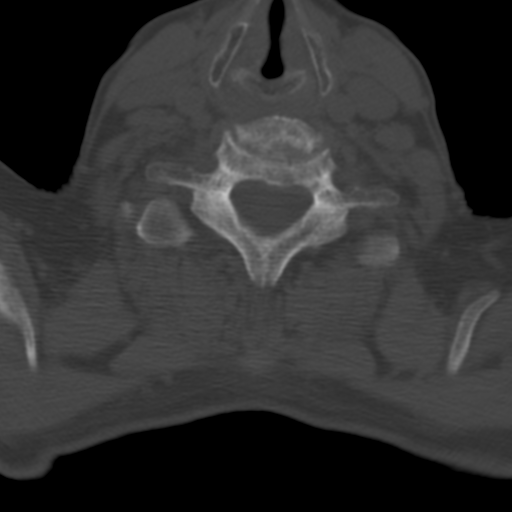
[im 35/88  bone]
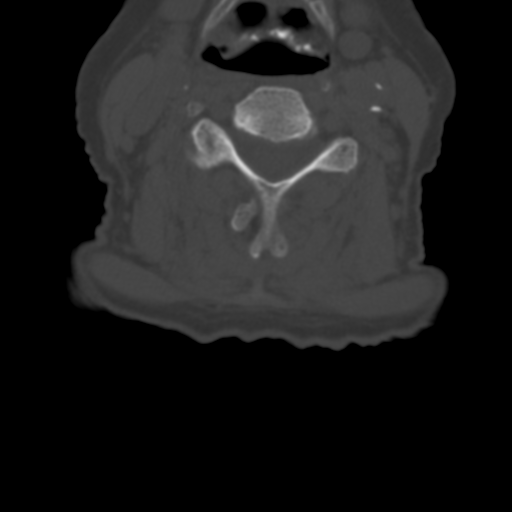
[im 53/88  bone]
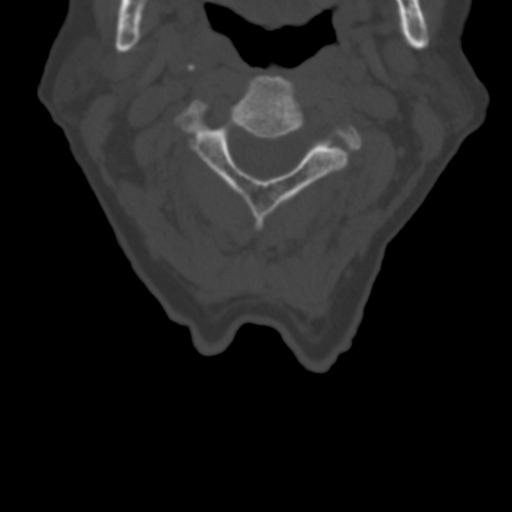

[Series 20: c_spine 2.0 coronal · coronal · 0.32mm/px · 3 of 68 slices shown]
[im 14/68  bone]
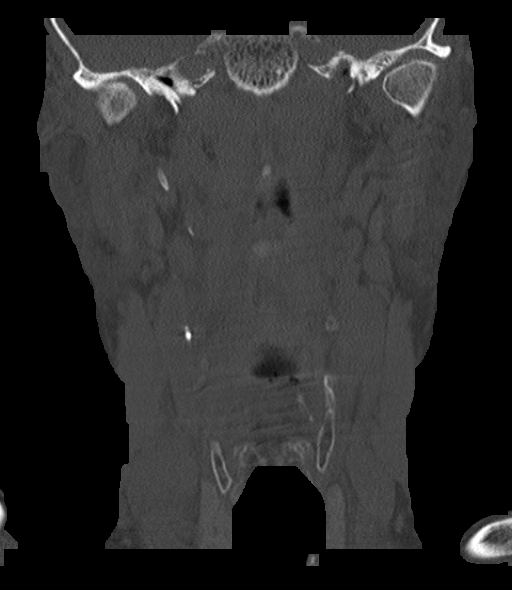
[im 27/68  bone]
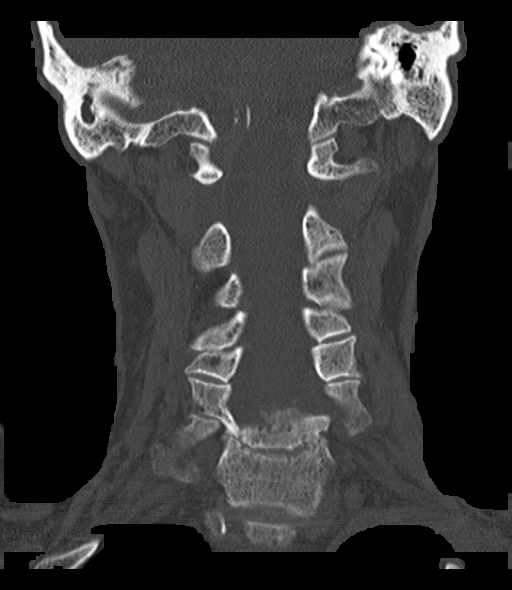
[im 41/68  bone]
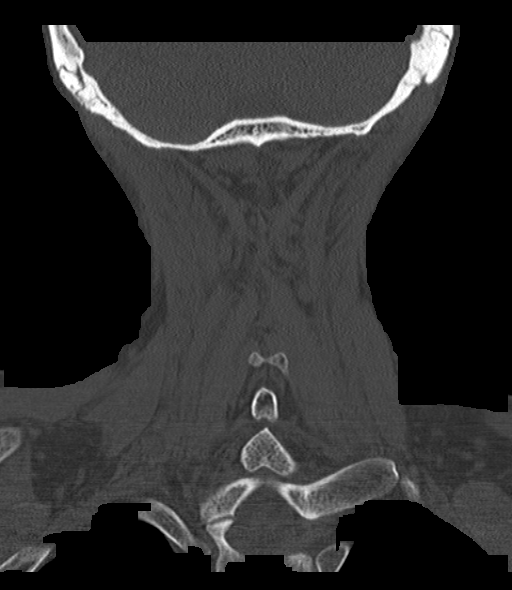

[Series 21: c_spine 2.0 sagittal · sagittal · 0.34mm/px · 5 of 82 slices shown, 6 images]
[im 28/82  bone]
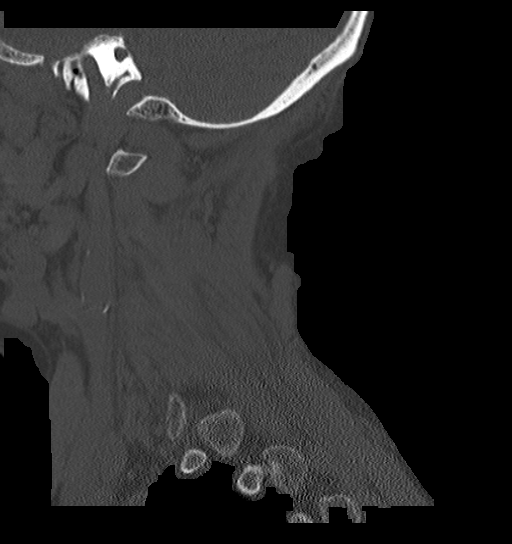
[im 34/82  bone]
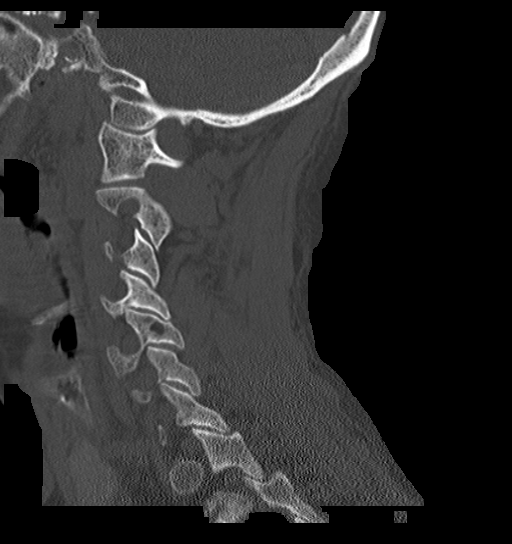
[im 41/82  soft-tissue]
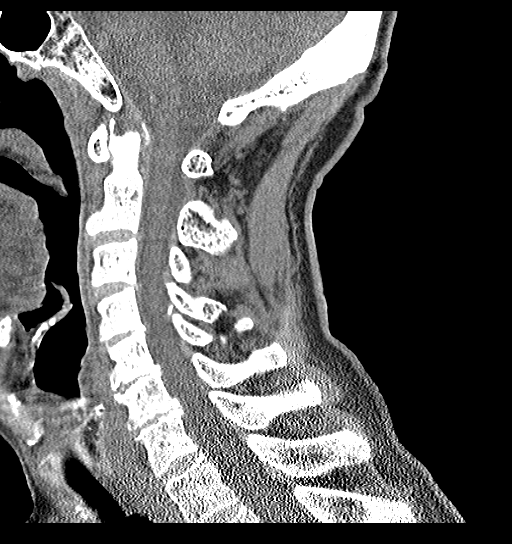
[im 41/82  bone]
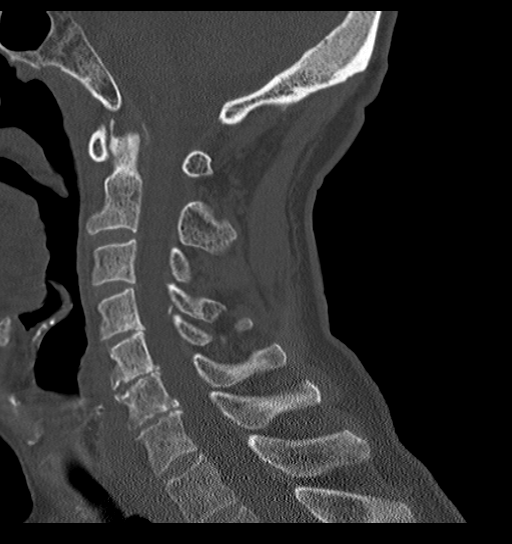
[im 48/82  bone]
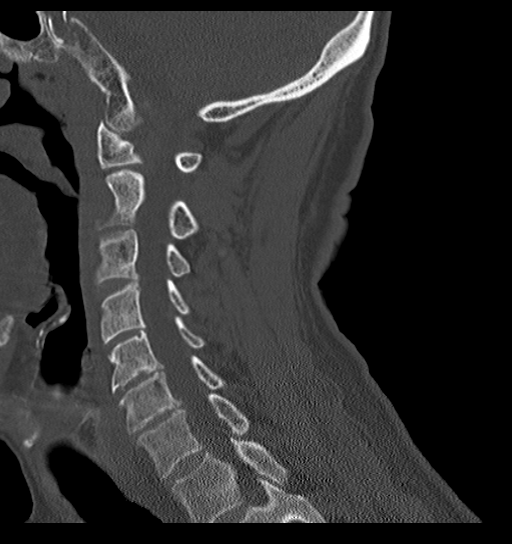
[im 55/82  bone]
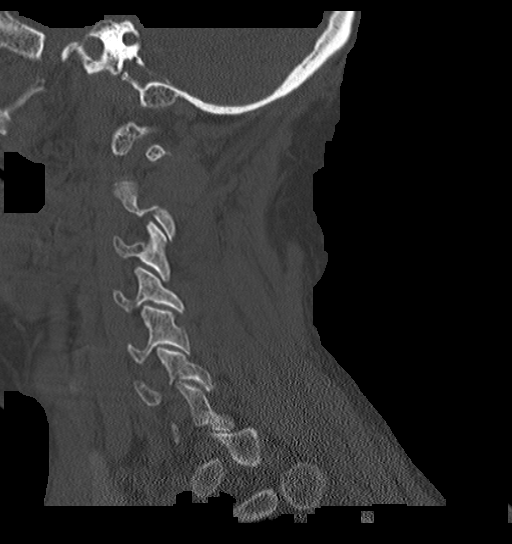

[Series 22: c_spine 2.0 orth ax · axial · 0.35mm/px · z∈[+943,+1055]mm · 4 of 94 slices shown, 5 images]
[im 19/94  soft-tissue]
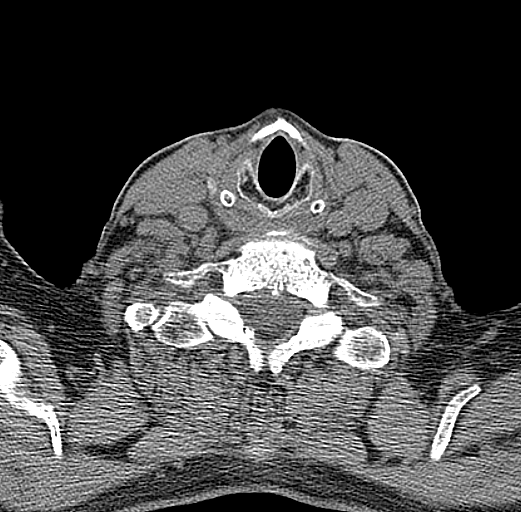
[im 19/94  bone]
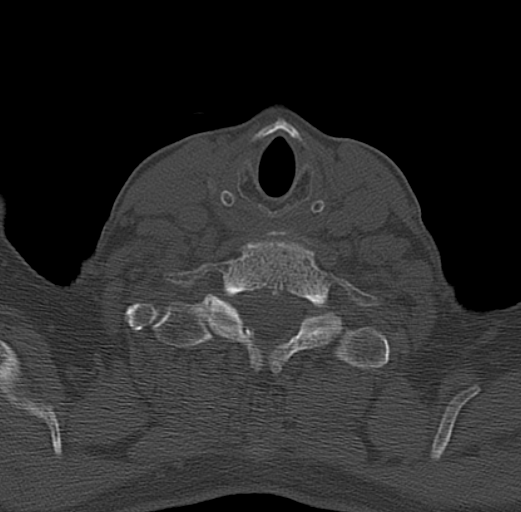
[im 38/94  bone]
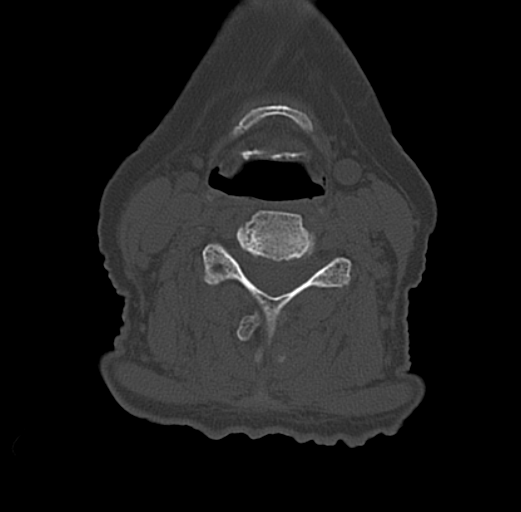
[im 56/94  bone]
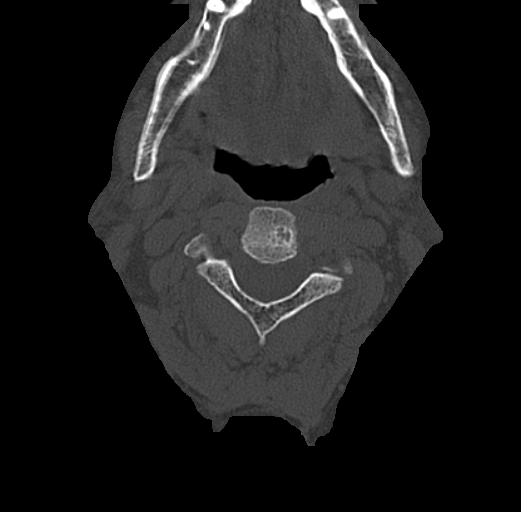
[im 75/94  bone]
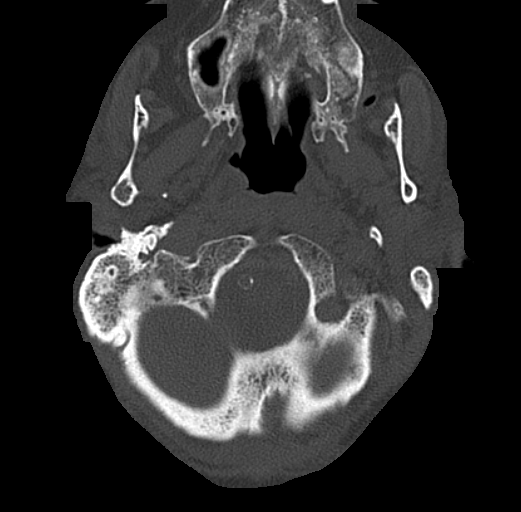

[15 of 33 positions shown; findings below may reference images not displayed]

FINDINGS: CT HEAD FINDINGS

There is moderate diffuse atrophy, stable. There is no intracranial
mass, hemorrhage, extra-axial fluid collection, or midline shift.
There is extensive small vessel disease throughout the centra
semiovale bilaterally, stable. There is no new gray-white
compartment lesion. No acute infarct apparent.

Bony calvarium appears intact. Mastoids are hypoplastic on the left
clear. Mastoids on the right are hypoplastic and opacified, a stable
finding. There are fractures of the right nasal bone as well as
air-fluid levels in each maxillary antrum.

CT MAXILLOFACIAL FINDINGS

There are comminuted right nasal bone fractures. No other fractures
are apparent. There is no dislocation. There are air-fluid levels in
both maxillary antra. There is diffuse opacification of the right
frontal sinus. Ostiomeatal unit complexes are patent bilaterally.
Other paranasal nasal sinuses are clear. There is no bony
destruction or expansion. There are no intraorbital lesions. There
is leftward deviation of the nasal septum.

CT CERVICAL SPINE FINDINGS

There is no fracture or spondylolisthesis. Prevertebral soft tissues
and predental space regions are normal. There is moderately severe
disc space narrowing at C5-6 and C6-7. There is milder narrowing at
C4-5. There is multilevel facet hypertrophy without appreciable
nerve root edema or effacement. No disc extrusion or stenosis. There
is a benign bone cyst in the region of the right pars
interarticularis at C5. There is calcification in both carotid
arteries. There is scarring in each lung apex.
IMPRESSION: CT head: Moderate diffuse atrophy with extensive periventricular
small vessel disease, stable. No intracranial mass, hemorrhage, or
acute appearing infarct. Opacification of hypoplastic mastoids on
the right is a stable finding. Mastoids on the left are hypoplastic
but clear. Nasal fractures noted.

CT maxillofacial: Comminuted right nasal fractures with air-fluid
levels in both maxillary antra. No other fractures. No dislocation.
Diffuse opacification of the right frontal sinus. Ostiomeatal unit
complexes are patent. There is leftward deviation of the nasal
septum. No intraorbital lesions noted.

CT cervical spine: Multifocal osteoarthritic change. No fracture or
spondylolisthesis. Calcification in both carotid arteries.

## 2016-04-08 IMAGING — CR DG CHEST 1V PORT
1 series · 1 of 1 positions shown · non-contrast
Comparison: 04/25/2014

CLINICAL DATA: Pneumonia.  Dyspnea.  Weakness.

EXAM:
PORTABLE CHEST - 1 VIEW

[AP]
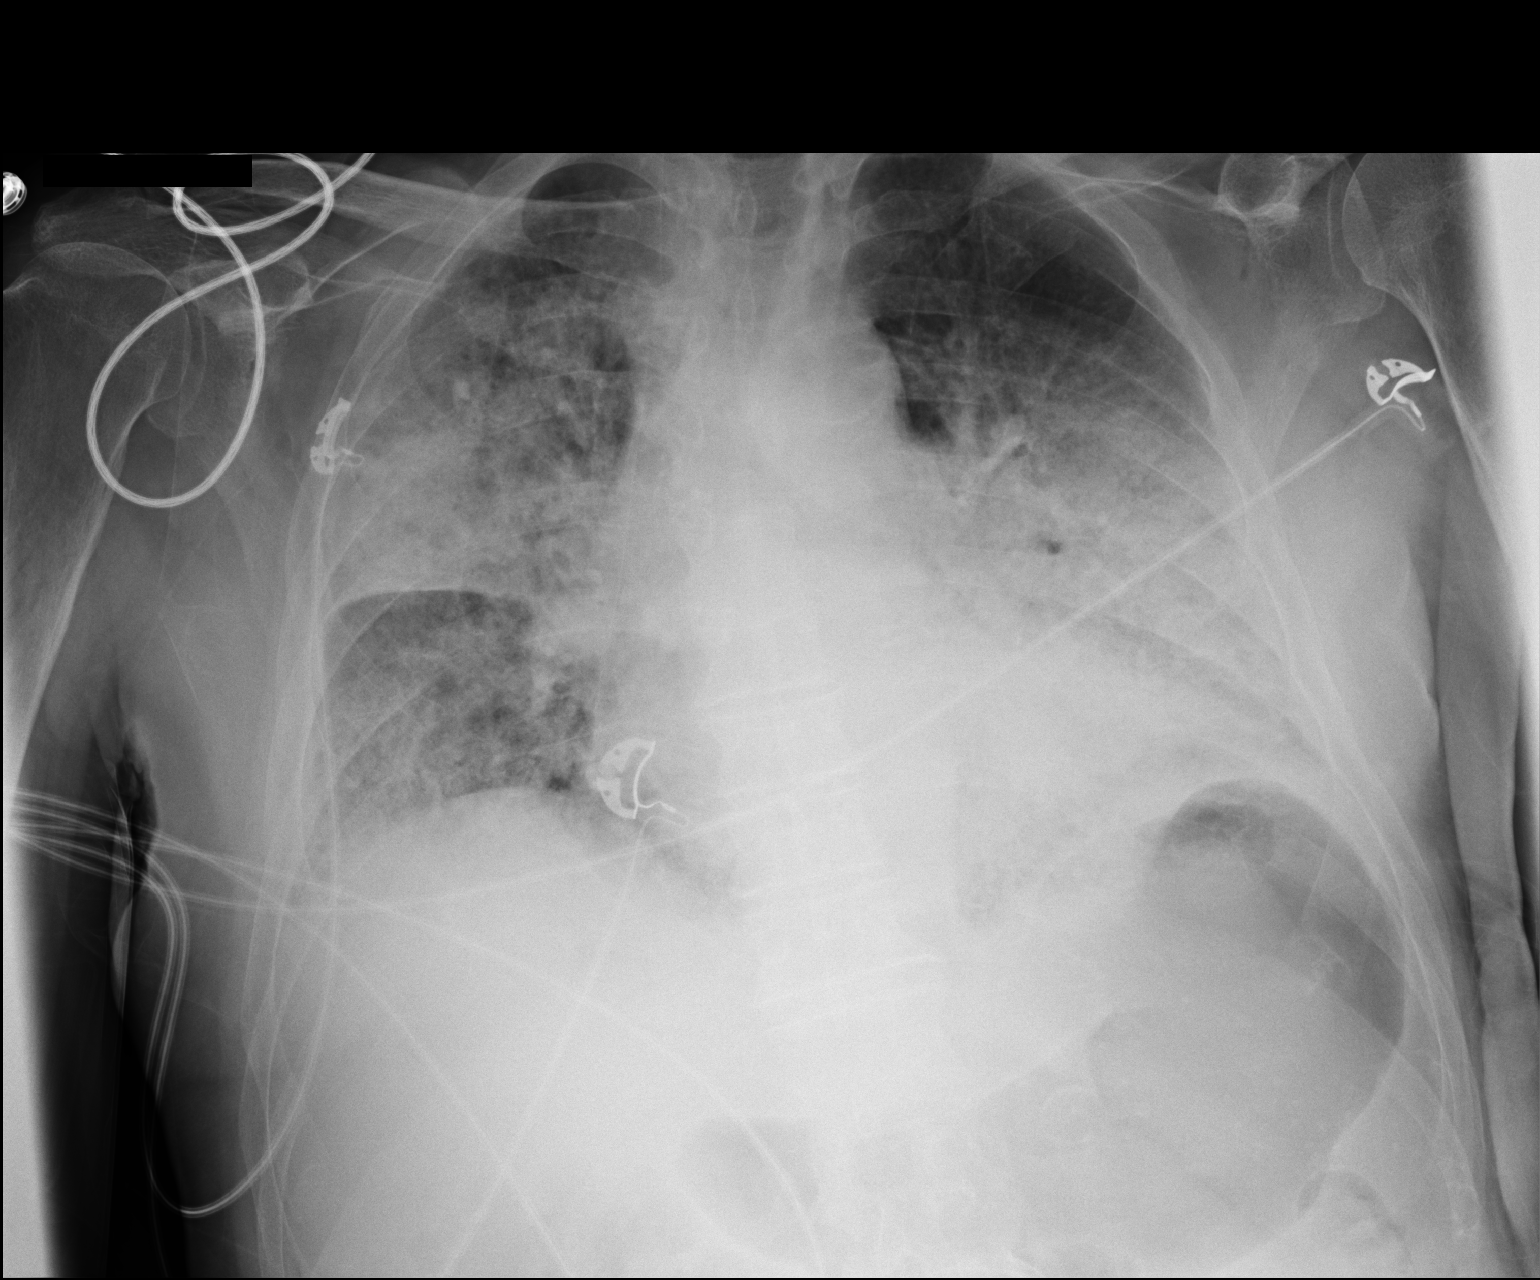

[1 of 1 positions shown; findings below may reference images not displayed]

FINDINGS: Worsening bilateral airspace opacities with consolidation much more
dense than the left perihilar region and right upper lobe.

Atherosclerotic aortic arch. Obscuration of the left heart border.
Underlying interstitial accentuation bilaterally in the lungs.

Upper thoracic scoliosis along with thoracic spondylosis.
IMPRESSION: 1. Worsening of bilateral airspace opacities favoring multi lobar
pneumonia.
